# Patient Record
Sex: Female | Born: 1985 | Race: Black or African American | Hispanic: No | Marital: Single | Smoking: Current every day smoker
Health system: Southern US, Community
[De-identification: ages and names within clinical notes are randomized; demographics above are authoritative.]

## PROBLEM LIST (undated history)

## (undated) HISTORY — PX: CHOLECYSTECTOMY: SHX55

## (undated) HISTORY — PX: WISDOM TOOTH EXTRACTION: SHX21

---
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
## 2007-08-26 ENCOUNTER — Emergency Department (HOSPITAL_COMMUNITY): Admission: EM | Admit: 2007-08-26 | Discharge: 2007-08-26 | Payer: Self-pay | Admitting: Emergency Medicine

## 2009-04-23 ENCOUNTER — Emergency Department (HOSPITAL_COMMUNITY): Admission: EM | Admit: 2009-04-23 | Discharge: 2009-04-23 | Payer: Self-pay | Admitting: Family Medicine

## 2009-04-23 ENCOUNTER — Inpatient Hospital Stay (HOSPITAL_COMMUNITY): Admission: EM | Admit: 2009-04-23 | Discharge: 2009-04-25 | Payer: Self-pay | Admitting: Emergency Medicine

## 2009-04-24 ENCOUNTER — Encounter (INDEPENDENT_AMBULATORY_CARE_PROVIDER_SITE_OTHER): Payer: Self-pay | Admitting: General Surgery

## 2009-11-12 ENCOUNTER — Emergency Department (HOSPITAL_COMMUNITY): Admission: EM | Admit: 2009-11-12 | Discharge: 2009-11-12 | Payer: Self-pay | Admitting: Emergency Medicine

## 2010-02-05 ENCOUNTER — Emergency Department (HOSPITAL_COMMUNITY): Admission: EM | Admit: 2010-02-05 | Discharge: 2010-02-05 | Payer: Self-pay | Admitting: Emergency Medicine

## 2010-08-30 LAB — DIFFERENTIAL
Eosinophils Absolute: 0 10*3/uL (ref 0.0–0.7)
Lymphocytes Relative: 7 % — ABNORMAL LOW (ref 12–46)
Lymphs Abs: 1.1 10*3/uL (ref 0.7–4.0)
Monocytes Relative: 8 % (ref 3–12)
Neutrophils Relative %: 85 % — ABNORMAL HIGH (ref 43–77)

## 2010-08-30 LAB — COMPREHENSIVE METABOLIC PANEL
ALT: 23 U/L (ref 0–35)
AST: 24 U/L (ref 0–37)
AST: 36 U/L (ref 0–37)
Albumin: 3 g/dL — ABNORMAL LOW (ref 3.5–5.2)
Alkaline Phosphatase: 59 U/L (ref 39–117)
BUN: 3 mg/dL — ABNORMAL LOW (ref 6–23)
CO2: 25 mEq/L (ref 19–32)
CO2: 27 mEq/L (ref 19–32)
CO2: 30 mEq/L (ref 19–32)
Calcium: 9.1 mg/dL (ref 8.4–10.5)
Calcium: 9.2 mg/dL (ref 8.4–10.5)
Chloride: 103 mEq/L (ref 96–112)
Creatinine, Ser: 0.57 mg/dL (ref 0.4–1.2)
Creatinine, Ser: 0.59 mg/dL (ref 0.4–1.2)
Creatinine, Ser: 0.6 mg/dL (ref 0.4–1.2)
GFR calc Af Amer: >60 mL/min (ref >60)
GFR calc Af Amer: >60 mL/min (ref >60)
GFR calc non Af Amer: >60 mL/min (ref >60)
GFR calc non Af Amer: >60 mL/min (ref >60)
GFR calc non Af Amer: >60 mL/min (ref >60)
Glucose, Bld: 101 mg/dL — ABNORMAL HIGH (ref 70–99)
Glucose, Bld: 83 mg/dL (ref 70–99)
Potassium: 3.3 mEq/L — ABNORMAL LOW (ref 3.5–5.1)
Sodium: 134 mEq/L — ABNORMAL LOW (ref 135–145)
Sodium: 140 mEq/L (ref 135–145)
Total Bilirubin: 1.4 mg/dL — ABNORMAL HIGH (ref 0.3–1.2)
Total Protein: 6.7 g/dL (ref 6.0–8.3)
Total Protein: 7.8 g/dL (ref 6.0–8.3)

## 2010-08-30 LAB — CBC
HCT: 33.4 % — ABNORMAL LOW (ref 36.0–46.0)
Hemoglobin: 11.2 g/dL — ABNORMAL LOW (ref 12.0–15.0)
MCHC: 33.5 g/dL (ref 30.0–36.0)
MCHC: 33.7 g/dL (ref 30.0–36.0)
MCV: 78.9 fL (ref 78.0–100.0)
MCV: 79.1 fL (ref 78.0–100.0)
MCV: 79.5 fL (ref 78.0–100.0)
RBC: 4.07 MIL/uL (ref 3.87–5.11)
RBC: 4.23 MIL/uL (ref 3.87–5.11)
RDW: 13.5 % (ref 11.5–15.5)
RDW: 13.5 % (ref 11.5–15.5)
WBC: 15.9 10*3/uL — ABNORMAL HIGH (ref 4.0–10.5)

## 2010-08-30 LAB — POCT URINALYSIS DIP (DEVICE)
Nitrite: NEGATIVE
Protein, ur: 30 mg/dL — AB
pH: 6.5 (ref 5.0–8.0)

## 2010-08-30 LAB — PROTIME-INR
INR: 1.27 (ref 0.00–1.49)
Prothrombin Time: 15.8 seconds — ABNORMAL HIGH (ref 11.6–15.2)

## 2010-08-30 LAB — APTT: aPTT: 40 seconds — ABNORMAL HIGH (ref 24–37)

## 2010-08-30 LAB — LIPASE, BLOOD: Lipase: 13 U/L (ref 11–59)

## 2011-06-24 ENCOUNTER — Emergency Department (HOSPITAL_COMMUNITY)
Admission: EM | Admit: 2011-06-24 | Discharge: 2011-06-25 | Disposition: A | Discharge status: Home / Self Care | Payer: Self-pay | Attending: Emergency Medicine | Admitting: Emergency Medicine

## 2011-06-24 ENCOUNTER — Encounter (HOSPITAL_COMMUNITY): Payer: Self-pay | Admitting: Emergency Medicine

## 2011-06-24 DIAGNOSIS — M79609 Pain in unspecified limb: Secondary | ICD-10-CM | POA: Insufficient documentation

## 2011-06-24 DIAGNOSIS — M7989 Other specified soft tissue disorders: Secondary | ICD-10-CM | POA: Insufficient documentation

## 2011-06-24 DIAGNOSIS — L0291 Cutaneous abscess, unspecified: Secondary | ICD-10-CM

## 2011-06-24 DIAGNOSIS — IMO0002 Reserved for concepts with insufficient information to code with codable children: Secondary | ICD-10-CM | POA: Insufficient documentation

## 2011-06-24 DIAGNOSIS — L0390 Cellulitis, unspecified: Secondary | ICD-10-CM

## 2011-06-24 DIAGNOSIS — L02411 Cutaneous abscess of right axilla: Secondary | ICD-10-CM

## 2011-06-24 MED ORDER — HYDROMORPHONE HCL PF 1 MG/ML IJ SOLN
1.0000 mg | Freq: Once | INTRAMUSCULAR | Status: AC
  Administered 2011-06-24: 1 mg via INTRAVENOUS
  Filled 2011-06-24: qty 1

## 2011-06-24 MED ORDER — CLINDAMYCIN PHOSPHATE 900 MG/50ML IV SOLN
900.0000 mg | INTRAVENOUS | Status: AC
  Administered 2011-06-25: 900 mg via INTRAVENOUS
  Filled 2011-06-24: qty 50

## 2011-06-24 MED ORDER — SODIUM CHLORIDE 0.9 % IV BOLUS (SEPSIS)
1000.0000 mL | Freq: Once | INTRAVENOUS | Status: AC
  Administered 2011-06-24: 1000 mL via INTRAVENOUS

## 2011-06-24 NOTE — ED Notes (Signed)
Pt with abscess to R axilla x 1 week.  Pt states she nicked her R underarm while shaving and now has pain, redness and frank drainage of puss to R axilla. Pt states that her OBGYN stated she may have had a MRSA blister once, but that it was never confirmed.    Pt appears in great pain.

## 2011-06-24 NOTE — ED Provider Notes (Signed)
History     CSN: [id number]  Arrival date & time 06/24/11  2130   First MD Initiated Contact with Patient 06/24/11 2305      Chief Complaint  Patient presents with  . Abscess    (Consider location/radiation/quality/duration/timing/severity/associated sxs/prior treatment)  Patient is a 26 y.o. female presenting with abscess. The history is provided by the patient. No language interpreter was used.  Abscess   Patient presents with R axilla abscess that began over 1 week ago. The onset was gradual. She states that it has increased in size, pain, and swelling since onset. Patient states that she has had an abscess before but never this big or painful. She repots that she has had MRSA before. She denies fever, chills, and sweats. She reports that it "popped" after she arrived and has been draining since.   History reviewed. No pertinent past medical history.  Past Surgical History  Procedure Date  . Cholecystectomy     No family history on file.  History  Substance Use Topics  . Smoking status: Current Everyday Smoker  . Smokeless tobacco: Not on file  . Alcohol Use: Yes    OB History    Grav Para Term Preterm Abortions TAB SAB Ect Mult Living                  Review of Systems All pertinent positives and negatives reviewed in the history of present illness  Allergies  Review of patient's allergies indicates no known allergies.  Home Medications   Current Outpatient Rx  Name Route Sig Dispense Refill  . IBUPROFEN 200 MG PO TABS Oral Take 600 mg by mouth once.    Gaylord Shih TRI-CYCLEN (28) PO Oral Take 1 tablet by mouth daily.      BP 140/73  Pulse 81  Temp(Src) 98.2 F (36.8 C) (Oral)  Resp 16  SpO2 99%  LMP 06/09/2011  Physical Exam  Constitutional: She is oriented to person, place, and time. She appears well-developed and well-nourished. No distress.  HENT:  Head: Normocephalic and atraumatic.  Cardiovascular: Normal rate, regular rhythm and normal heart  sounds.  Exam reveals no gallop and no friction rub.   No murmur heard. Pulmonary/Chest: Effort normal and breath sounds normal. She has no wheezes. She has no rales.  Neurological: She is alert and oriented to person, place, and time.  Skin: Skin is warm and dry. She is not diaphoretic.          Abscess in R axilla with purulent drainage. Surrounding erythema and swelling down R anterior arm. Very tender.  Psychiatric: She has a normal mood and affect. Her behavior is normal.    ED Course  Procedures (including critical care time)  Labs Reviewed - No data to display   INCISION AND DRAINAGE Performed by: Carlyle Dolly Consent: Verbal consent obtained. Risks and benefits: risks, benefits and alternatives were discussed Type: abscess  Body area: R axilla  Anesthesia: local infiltration  Local anesthetic: lidocaine 2% with epinephrine  Anesthetic total: 7 ml  Complexity: complex Blunt dissection to break up loculations  Drainage: purulent  Drainage amount: moderate  Packing material: 1/4 in iodoform gauze  Patient tolerance: Patient tolerated the procedure well with no immediate complications.    MDM  Patient with R axilla abscess. Patient has history of MRSA. Treated with IV Clindamycin.  Patient had an area of cellulitis extending from the area of abscess to her mid biceps area.  She is advised to return here in 2  days for recheck of the area and packing removal.  She will return here sooner for any worsening in her condition        Carlyle Dolly, PA-C 06/25/11 [phone]

## 2011-06-24 NOTE — ED Notes (Signed)
PT. REPORTS ABSCESS AT RIGHT AXILLA WITH DRAINAGE ONSET LAST WEEK .

## 2011-06-25 MED ORDER — HYDROCODONE-ACETAMINOPHEN 5-325 MG PO TABS: 1.0000 | ORAL_TABLET | ORAL | Status: AC | PRN

## 2011-06-25 MED ORDER — CLINDAMYCIN HCL 150 MG PO CAPS: ORAL_CAPSULE | ORAL | Status: DC

## 2011-06-25 NOTE — Discharge Instructions (Signed)
Return here in 2 days for recheck.  Keep the area covered.  You can use warm compresses around the area.

## 2011-06-25 NOTE — ED Provider Notes (Signed)
Medical screening examination/treatment/procedure(s) were performed by non-physician practitioner and as supervising physician I was immediately available for consultation/collaboration.  Loren Racer, MD 06/25/11 [phone]

## 2011-06-27 ENCOUNTER — Encounter (HOSPITAL_COMMUNITY): Payer: Self-pay | Admitting: *Deleted

## 2011-06-27 ENCOUNTER — Emergency Department (HOSPITAL_COMMUNITY)
Admission: EM | Admit: 2011-06-27 | Discharge: 2011-06-27 | Disposition: A | Discharge status: Home / Self Care | Payer: Self-pay | Attending: Emergency Medicine | Admitting: Emergency Medicine

## 2011-06-27 DIAGNOSIS — L02411 Cutaneous abscess of right axilla: Secondary | ICD-10-CM

## 2011-06-27 DIAGNOSIS — Z09 Encounter for follow-up examination after completed treatment for conditions other than malignant neoplasm: Secondary | ICD-10-CM | POA: Insufficient documentation

## 2011-06-27 DIAGNOSIS — IMO0002 Reserved for concepts with insufficient information to code with codable children: Secondary | ICD-10-CM | POA: Insufficient documentation

## 2011-06-27 NOTE — ED Notes (Signed)
Pt reports being here for wound recheck of abscess under right arm, had it drained on Monday? Pt has no other complaints.

## 2011-06-27 NOTE — ED Provider Notes (Signed)
Medical screening examination/treatment/procedure(s) were performed by non-physician practitioner and as supervising physician I was immediately available for consultation/collaboration.  Flint Melter, MD 06/27/11 2031

## 2011-06-27 NOTE — ED Provider Notes (Signed)
History     CSN: [id number]  Arrival date & time 06/27/11  1478   First MD Initiated Contact with Patient 06/27/11 0940      Chief Complaint  Patient presents with  . Wound Check    (Consider location/radiation/quality/duration/timing/severity/associated sxs/prior treatment) Patient is a 26 y.o. female presenting with wound check. The history is provided by the patient.  Wound Check  She was treated in the ED 2 to 3 days ago. Previous treatment in the ED includes I&D of abscess. Treatments since wound repair include oral antibiotics.  Pt with right axilla abscess I&D 3 days ago. Here for wound recheck. States feeling better. Denies worsening of the abscess, denies fever,chills, malaise. Still tender to palpation. On clindamycin.   History reviewed. No pertinent past medical history.  Past Surgical History  Procedure Date  . Cholecystectomy     History reviewed. No pertinent family history.  History  Substance Use Topics  . Smoking status: Current Everyday Smoker  . Smokeless tobacco: Not on file  . Alcohol Use: Yes    OB History    Grav Para Term Preterm Abortions TAB SAB Ect Mult Living                  Review of Systems  Constitutional: Negative for fever and chills.  HENT: Negative.   Eyes: Negative.   Respiratory: Negative.   Cardiovascular: Negative.   Gastrointestinal: Negative.   Musculoskeletal: Negative.   Skin: Positive for wound.  Neurological: Negative.   Psychiatric/Behavioral: Negative.     Allergies  Review of patient's allergies indicates no known allergies.  Home Medications   Current Outpatient Rx  Name Route Sig Dispense Refill  . CLINDAMYCIN HCL 150 MG PO CAPS Oral Take 150 mg by mouth 3 (three) times daily. 3 PO TID for 10 days    . HYDROCODONE-ACETAMINOPHEN 5-325 MG PO TABS Oral Take 1 tablet by mouth every 4 (four) hours as needed for pain. 15 tablet 0  . IBUPROFEN 200 MG PO TABS Oral Take 600 mg by mouth once.    Gaylord Shih  TRI-CYCLEN (28) PO Oral Take 1 tablet by mouth daily.      BP 128/70  Pulse 77  Temp(Src) 98.2 F (36.8 C) (Oral)  Resp 16  SpO2 99%  LMP 06/09/2011  Physical Exam  Nursing note and vitals reviewed. Constitutional: She is oriented to person, place, and time. She appears well-developed and well-nourished. No distress.  HENT:  Head: Normocephalic and atraumatic.  Eyes: Conjunctivae are normal.  Neck: Neck supple.  Cardiovascular: Normal rate, regular rhythm and normal heart sounds.   Pulmonary/Chest: Effort normal and breath sounds normal. No respiratory distress.  Musculoskeletal: Normal range of motion.  Neurological: She is alert and oriented to person, place, and time.  Skin: Skin is warm and dry. No rash noted. No erythema.       3cm abscess to the right axilla, packed. No surrounding cellulitis, no drainage, tender  Psychiatric: She has a normal mood and affect.    ED Course  Procedures (including critical care time)  Abscess healing well. No surrounding cellulitis. Packing removed. No drainage. WIll d/c home with follow up as needed. Continue antibiotics.  No diagnosis found.    MDM         Lottie Mussel, PA 06/27/11 0959  Lottie Mussel, PA 06/27/11 1000

## 2012-02-05 IMAGING — CR DG FOOT COMPLETE 3+V*R*
3 series · 3 of 3 positions shown · non-contrast
Comparison: None.

CLINICAL DATA: Foot injury and pain.  Stepped on glass.

RIGHT FOOT COMPLETE - 3+ VIEW

[t foot ap right]
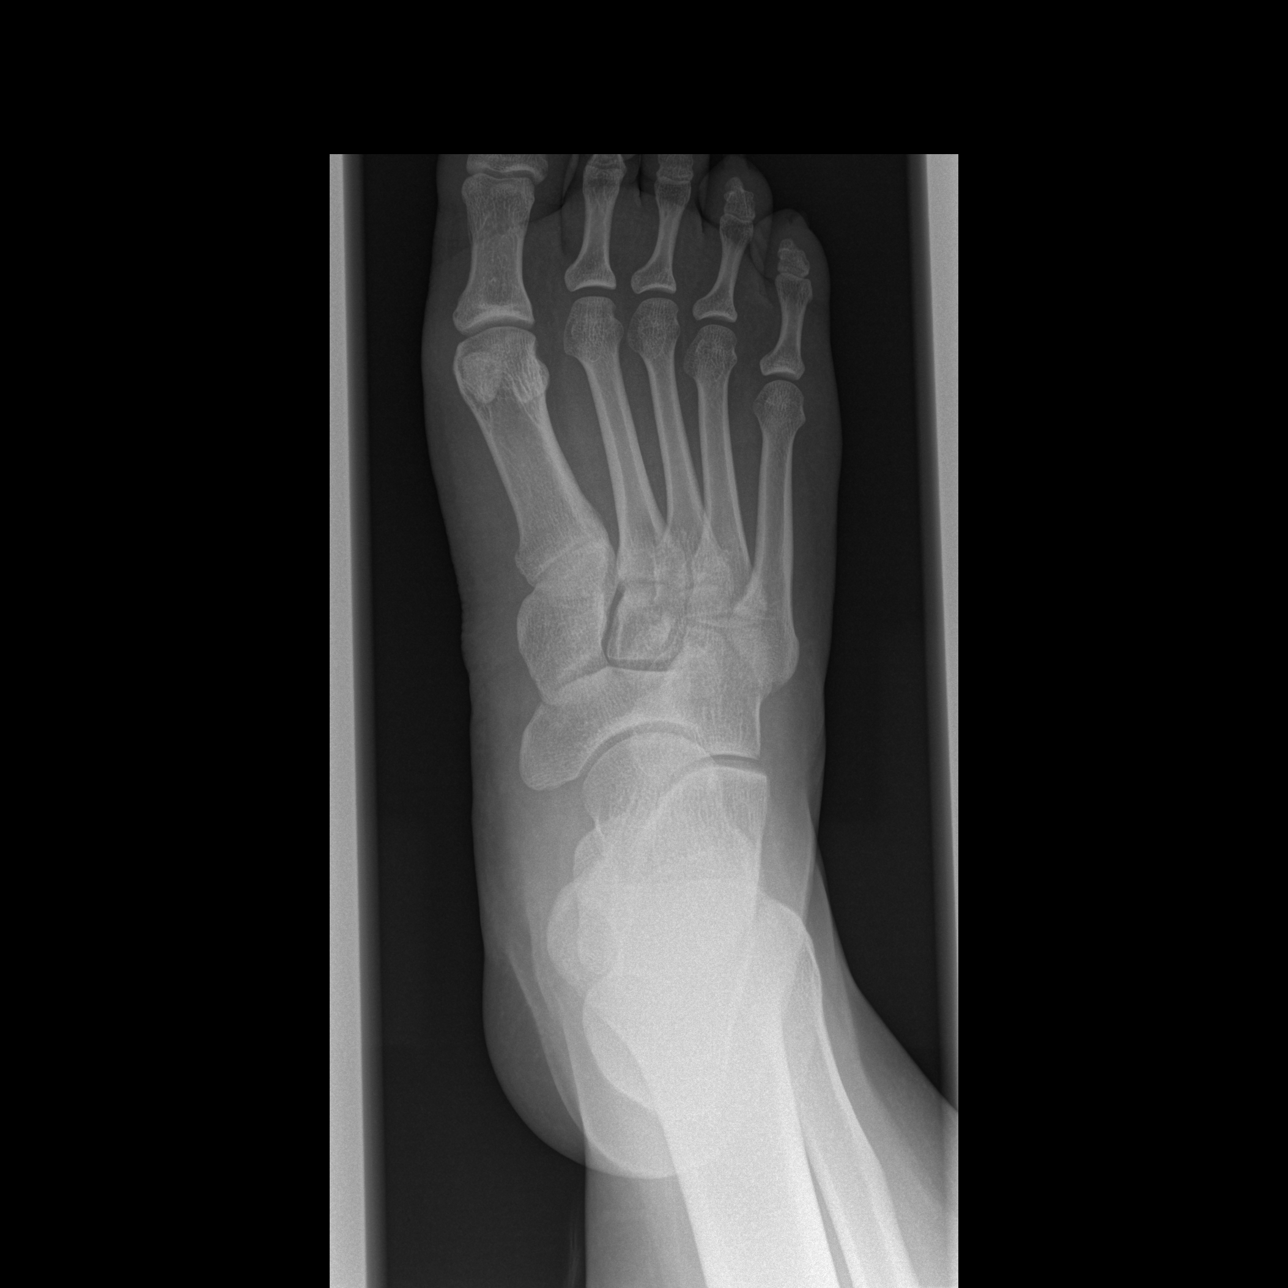

[t foot oblique right]
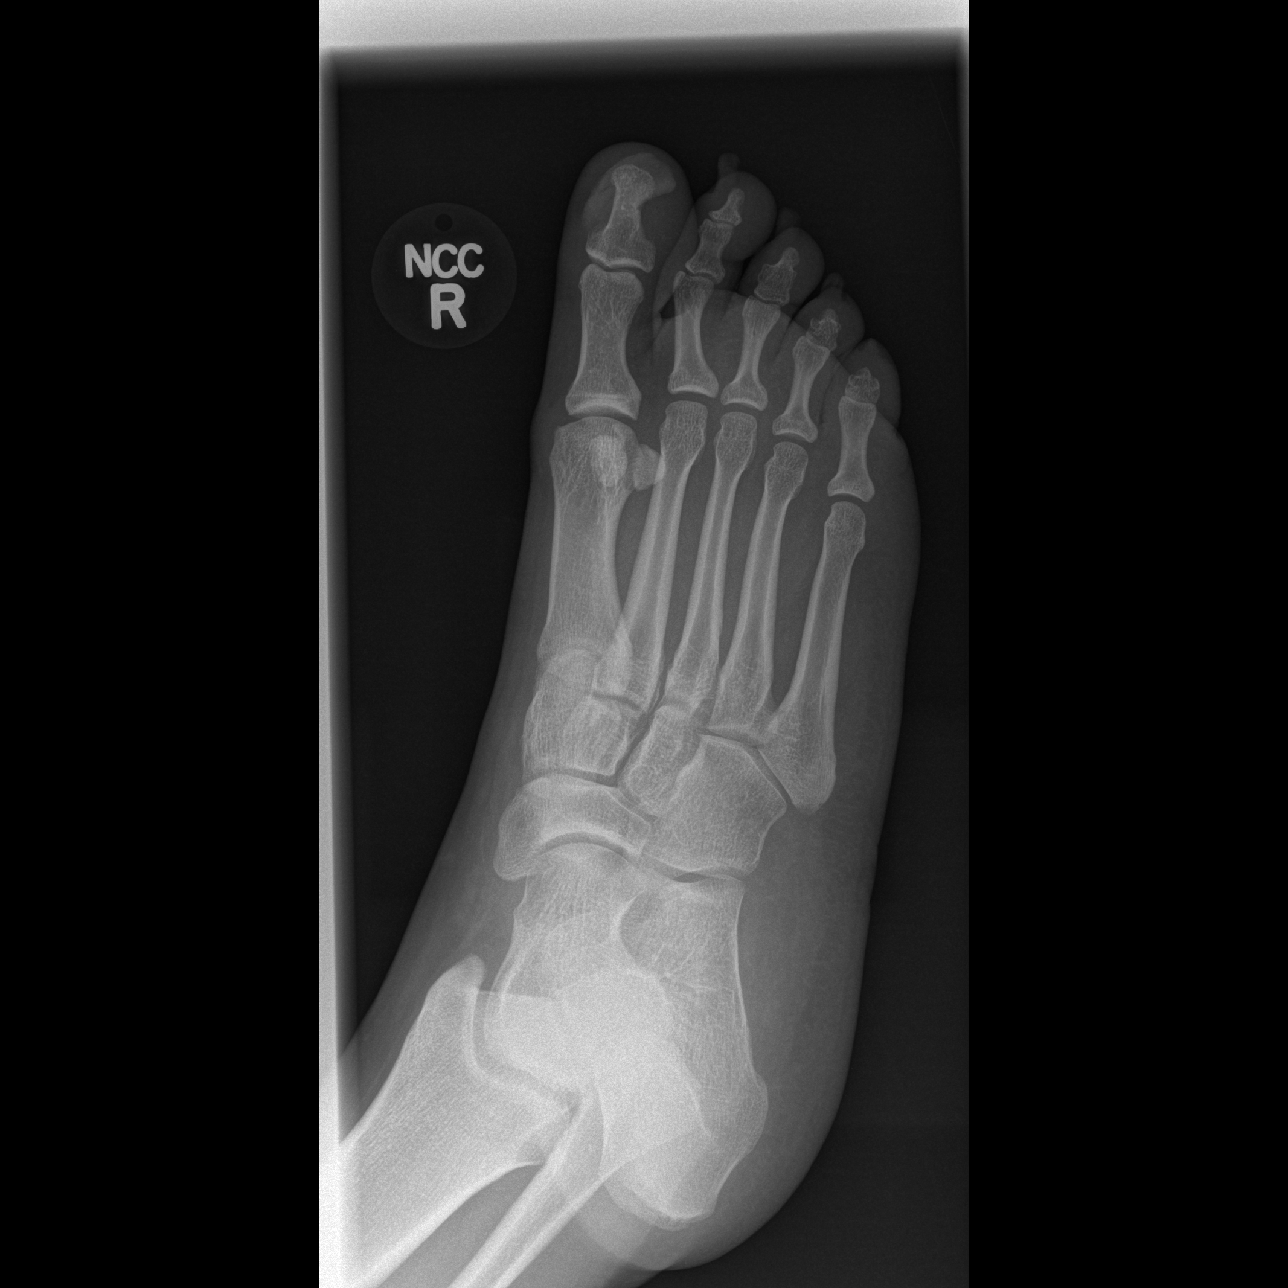

[t foot lat right]
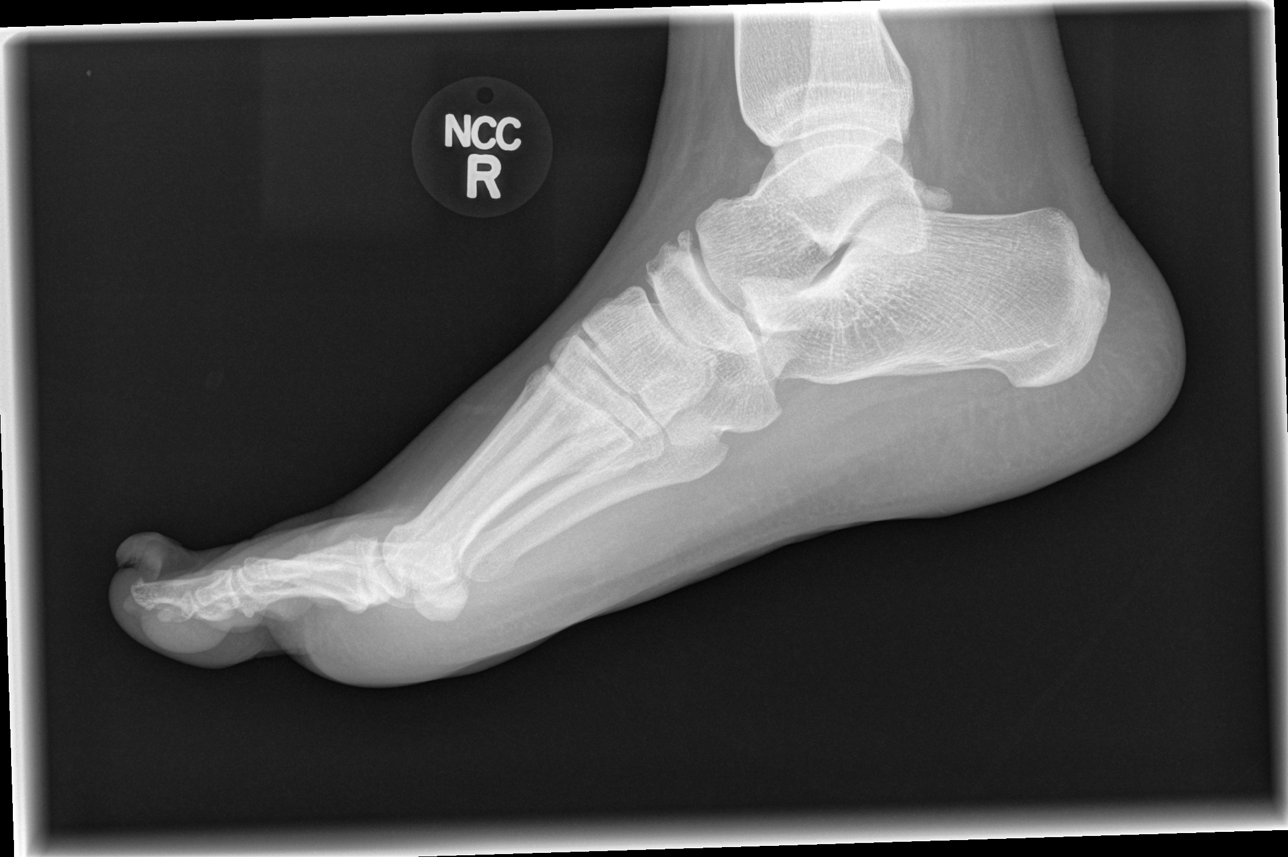

[3 of 3 positions shown; findings below may reference images not displayed]

FINDINGS: There is no evidence of fracture or dislocation.  There
is no evidence of arthropathy or other focal bone abnormality.
Soft tissues are unremarkable. No evidence of radiopaque foreign
body.
IMPRESSION: Negative.

## 2019-10-27 LAB — OB RESULTS CONSOLE RUBELLA ANTIBODY, IGM: Rubella: IMMUNE

## 2019-10-27 LAB — OB RESULTS CONSOLE HIV ANTIBODY (ROUTINE TESTING): HIV: NONREACTIVE

## 2019-10-27 LAB — OB RESULTS CONSOLE HEPATITIS B SURFACE ANTIGEN: Hepatitis B Surface Ag: NEGATIVE

## 2019-11-03 LAB — OB RESULTS CONSOLE GC/CHLAMYDIA
Chlamydia: NEGATIVE
Gonorrhea: NEGATIVE

## 2020-03-07 LAB — OB RESULTS CONSOLE RPR: RPR: NONREACTIVE

## 2020-05-02 LAB — OB RESULTS CONSOLE GBS: GBS: NEGATIVE

## 2020-05-12 ENCOUNTER — Other Ambulatory Visit: Payer: Self-pay | Admitting: Obstetrics & Gynecology

## 2020-05-13 ENCOUNTER — Telehealth (HOSPITAL_COMMUNITY): Payer: Self-pay | Admitting: *Deleted

## 2020-05-13 ENCOUNTER — Encounter (HOSPITAL_COMMUNITY): Payer: Self-pay | Admitting: *Deleted

## 2020-05-13 NOTE — Telephone Encounter (Signed)
Preadmission screen  

## 2020-05-23 ENCOUNTER — Other Ambulatory Visit (HOSPITAL_COMMUNITY): Payer: Self-pay

## 2020-05-24 ENCOUNTER — Inpatient Hospital Stay (HOSPITAL_COMMUNITY)
Admission: AD | Admit: 2020-05-24 | Payer: BC Managed Care – PPO | Source: Home / Self Care | Admitting: Obstetrics & Gynecology

## 2020-05-24 ENCOUNTER — Inpatient Hospital Stay (HOSPITAL_COMMUNITY): Payer: BC Managed Care – PPO

## 2020-05-27 ENCOUNTER — Other Ambulatory Visit (HOSPITAL_COMMUNITY): Payer: Self-pay

## 2020-05-28 NOTE — L&D Delivery Note (Signed)
Delivery Note At 5:54 AM a viable and healthy female was delivered via Vaginal, Spontaneous (Presentation: OA   ).  APGAR: 9, 9; weight pending .   Placenta status: Spontaneous, Intact.  Cord: 3 vessels with the following complications: None.  Cord pH: NA  Anesthesia:  Epidural Episiotomy: None Lacerations:  Small 1st degree vaginal  Suture Repair: 3.0 vicryl rapide Est. Blood Loss (mL):  650 cc-- > 1000 mcg rectal Cytotec placed for lower segment atony  Mom to postpartum.  Baby to Couplet care / Skin to Skin.  Robley Fries 05/30/2020, 6:17 AM

## 2020-05-29 ENCOUNTER — Inpatient Hospital Stay (HOSPITAL_COMMUNITY): Payer: BC Managed Care – PPO

## 2020-05-29 ENCOUNTER — Other Ambulatory Visit: Payer: Self-pay

## 2020-05-29 ENCOUNTER — Inpatient Hospital Stay (HOSPITAL_COMMUNITY)
Admission: AD | Admit: 2020-05-29 | Discharge: 2020-06-01 | DRG: 807 | Disposition: A | Discharge status: Home / Self Care | Payer: BC Managed Care – PPO | Attending: Obstetrics & Gynecology | Admitting: Obstetrics & Gynecology

## 2020-05-29 ENCOUNTER — Encounter (HOSPITAL_COMMUNITY): Payer: Self-pay | Admitting: Obstetrics & Gynecology

## 2020-05-29 DIAGNOSIS — O99334 Smoking (tobacco) complicating childbirth: Secondary | ICD-10-CM | POA: Diagnosis present

## 2020-05-29 DIAGNOSIS — O99344 Other mental disorders complicating childbirth: Secondary | ICD-10-CM | POA: Diagnosis present

## 2020-05-29 DIAGNOSIS — O99214 Obesity complicating childbirth: Principal | ICD-10-CM | POA: Diagnosis present

## 2020-05-29 DIAGNOSIS — O139 Gestational [pregnancy-induced] hypertension without significant proteinuria, unspecified trimester: Secondary | ICD-10-CM

## 2020-05-29 DIAGNOSIS — Z20822 Contact with and (suspected) exposure to covid-19: Secondary | ICD-10-CM | POA: Diagnosis present

## 2020-05-29 DIAGNOSIS — Z658 Other specified problems related to psychosocial circumstances: Secondary | ICD-10-CM

## 2020-05-29 DIAGNOSIS — F17200 Nicotine dependence, unspecified, uncomplicated: Secondary | ICD-10-CM | POA: Diagnosis present

## 2020-05-29 DIAGNOSIS — F32A Depression, unspecified: Secondary | ICD-10-CM | POA: Diagnosis present

## 2020-05-29 DIAGNOSIS — O134 Gestational [pregnancy-induced] hypertension without significant proteinuria, complicating childbirth: Secondary | ICD-10-CM | POA: Diagnosis present

## 2020-05-29 DIAGNOSIS — Z3A40 40 weeks gestation of pregnancy: Secondary | ICD-10-CM | POA: Diagnosis not present

## 2020-05-29 DIAGNOSIS — F419 Anxiety disorder, unspecified: Secondary | ICD-10-CM | POA: Diagnosis present

## 2020-05-29 DIAGNOSIS — Z6841 Body Mass Index (BMI) 40.0 and over, adult: Secondary | ICD-10-CM

## 2020-05-29 DIAGNOSIS — Z3490 Encounter for supervision of normal pregnancy, unspecified, unspecified trimester: Secondary | ICD-10-CM | POA: Diagnosis present

## 2020-05-29 LAB — CBC
HCT: 41.4 % (ref 36.0–46.0)
Hemoglobin: 13.4 g/dL (ref 12.0–15.0)
MCH: 25.7 pg — ABNORMAL LOW (ref 26.0–34.0)
MCHC: 32.4 g/dL (ref 30.0–36.0)
MCV: 79.3 fL — ABNORMAL LOW (ref 80.0–100.0)
Platelets: 258 10*3/uL (ref 150–400)
RBC: 5.22 MIL/uL — ABNORMAL HIGH (ref 3.87–5.11)
RDW: 14.8 % (ref 11.5–15.5)
WBC: 8.8 10*3/uL (ref 4.0–10.5)
nRBC: 0 % (ref 0.0–0.2)

## 2020-05-29 LAB — RESP PANEL BY RT-PCR (FLU A&B, COVID) ARPGX2
Influenza A by PCR: NEGATIVE
Influenza B by PCR: NEGATIVE
SARS Coronavirus 2 by RT PCR: NEGATIVE

## 2020-05-29 LAB — TYPE AND SCREEN
ABO/RH(D): AB POS
Antibody Screen: NEGATIVE

## 2020-05-29 MED ORDER — OXYTOCIN BOLUS FROM INFUSION
333.0000 mL | Freq: Once | INTRAVENOUS | Status: AC
Start: 2020-05-29 — End: 2020-05-30
  Administered 2020-05-30: 333 mL via INTRAVENOUS

## 2020-05-29 MED ORDER — OXYTOCIN-SODIUM CHLORIDE 30-0.9 UT/500ML-% IV SOLN
1.0000 m[IU]/min | INTRAVENOUS | Status: DC
Start: 2020-05-29 — End: 2020-05-30
  Administered 2020-05-29: 1 m[IU]/min via INTRAVENOUS

## 2020-05-29 MED ORDER — TERBUTALINE SULFATE 1 MG/ML IJ SOLN: 0.2500 mg | Freq: Once | INTRAMUSCULAR | Status: DC | PRN

## 2020-05-29 MED ORDER — SOD CITRATE-CITRIC ACID 500-334 MG/5ML PO SOLN: 30.0000 mL | ORAL | Status: DC | PRN

## 2020-05-29 MED ORDER — BUTORPHANOL TARTRATE 1 MG/ML IJ SOLN: 1.0000 mg | INTRAMUSCULAR | Status: DC | PRN

## 2020-05-29 MED ORDER — LACTATED RINGERS IV SOLN: INTRAVENOUS | Status: DC

## 2020-05-29 MED ORDER — OXYTOCIN-SODIUM CHLORIDE 30-0.9 UT/500ML-% IV SOLN
2.5000 [IU]/h | INTRAVENOUS | Status: DC
  Administered 2020-05-30: 2.5 [IU]/h via INTRAVENOUS
  Filled 2020-05-29: qty 500

## 2020-05-29 MED ORDER — LIDOCAINE HCL (PF) 1 % IJ SOLN: 30.0000 mL | INTRAMUSCULAR | Status: DC | PRN

## 2020-05-29 MED ORDER — MISOPROSTOL 25 MCG QUARTER TABLET
25.0000 ug | ORAL_TABLET | ORAL | Status: AC | PRN
  Administered 2020-05-29 (×2): 25 ug via VAGINAL
  Filled 2020-05-29 (×2): qty 1

## 2020-05-29 MED ORDER — ONDANSETRON HCL 4 MG/2ML IJ SOLN
4.0000 mg | Freq: Four times a day (QID) | INTRAMUSCULAR | Status: DC | PRN
  Administered 2020-05-30: 4 mg via INTRAVENOUS
  Filled 2020-05-29: qty 2

## 2020-05-29 MED ORDER — ACETAMINOPHEN 325 MG PO TABS: 650.0000 mg | ORAL_TABLET | ORAL | Status: DC | PRN

## 2020-05-29 MED ORDER — LACTATED RINGERS IV SOLN: 500.0000 mL | INTRAVENOUS | Status: DC | PRN

## 2020-05-29 NOTE — Progress Notes (Signed)
Spoke with patient, she picked up her phone time time. Reports she is waiting on her Mom or FOB to bring her in. Mother is on vacation in [place] and [place] is in [place] and now in [place], only 45 min away, was supposed to get here by 10, so she told hospital RN she can come in by 10.30 am and is 12 noon now.  She didn't ask a friend or decide to take Benedetto Goad to hospital for IOL. Poor support structure, will not improve now or postpartum, advised her to come in regardless of driver, hospital/ me or RN doesn't need support person if they are not caring enough to show up, she needs to drive to hospital herself and FOB or her mother can show up when/ if they want.  She agreed. Reviewed post-term with BMI 53 as her risk factor, needing IOL --V.Ree Alcalde MD

## 2020-05-29 NOTE — Progress Notes (Signed)
IOL 40.1 wks, for dates and BMI 53. S/p Cytotec  2 doses. Patient arrived for IOL late due to not having a ride.   Subjective: No complaints   Objective: BP (!) 148/75   Pulse 81   Temp 98 F (36.7 C) (Oral)   Resp 17   Ht 5\' 3"  (1.6 m)   Wt (!) 136.4 kg   BMI 53.28 kg/m   FHT:  FHR: 150 bpm, variability: moderate,  accelerations:  Present,  decelerations:  Absent UC:   regular, every 2 minutes SVE:   Dilation: 2 Effacement (%): 60 Station: -2 Exam by:: [email or web address] Chipps RN  Assessment / Plan: IOL, S/p cytotec x 2 doses. Favorable cervix now, UCs noted. Okay to start pitocin to maintain UCs patter, d/w RN, avoid hyperstimulation and hold back pitocin if UCs q 1-2 min.   Fetal Wellbeing:  Category I Pain Control:  Options d/w pt  Anticipated MOD:  NSVD planned, EFW 7.1/2 - 8 lbs  Irving Burton 05/29/2020, 10:47 PM

## 2020-05-29 NOTE — Progress Notes (Signed)
IOL 40.1 wks, for dates and BMI 53. S/p Cytotec  2 doses. Patient arrived for IOL late due to not having a ride.   Subjective: No complaints   Objective: BP (!) 148/75   Pulse 81   Temp 98 F (36.7 C) (Oral)   Resp 17   Ht 5\' 3"  (1.6 m)   Wt (!) 136.4 kg   BMI 53.28 kg/m   FHT:  FHR: 150 bpm, variability: moderate,  accelerations:  Present,  decelerations:  Absent UC:   regular, every 2 minutes SVE:   Dilation: 2 Effacement (%): 60 Station: -2 Exam by:: [email or web address] Chipps RN  Assessment / Plan: IOL, S/p cytotec x 2 doses. Favorable cervix now, UCs noted. Okay to start pitocin to maintain UCs patter, d/w RN, avoid hyperstimulation and hold back pitocin if UCs q 1-2 min.   Fetal Wellbeing:  Category I Pain Control:  Options d/w pt  Anticipated MOD:  NSVD planned, EFW 7.1/2  lbs  Irving Burton 05/29/2020, 10:50 PM

## 2020-05-29 NOTE — Progress Notes (Signed)
Patient has been called by L&D to come in ASAP for AM IOL but is awaiting a ride by family member

## 2020-05-29 NOTE — H&P (Addendum)
[place] Sonya Brooks is a 35 y.o. female G3P0020, is supposed to for IOL, [facility] 05/28/20. Marland Kitchen Pt presented around 1.30 pm.  She was supposed to be here for IOL b/w 8-9  am, L&D RN called, she said she is waiting on support person to bring her, they are not in town. I called her 4 times- not picking up my calls. Maternal obesity, depression/ anxiety, poor family support, FOB in picture but not available most days since truck driver Growth for S>D, obesity  - 28 wks 2'12" 48% and at 34 wks- 4'15" with AC 17%. Weekly NST from 36 wks for obesity- reactive, missed 38 wk visit due to insurance issues  TAB and SAB Hx. Unplanned pregnancy, FOB has no interest after being together 4 yrs.   OB History    Gravida  3   Para      Term      Preterm      AB  2   Living        SAB  2   IAB      Ectopic      Multiple      Live Births             No past medical history on file. Past Surgical History:  Procedure Laterality Date  . CHOLECYSTECTOMY    . WISDOM TOOTH EXTRACTION     Family History: family history includes Diabetes in her maternal grandfather, maternal grandmother, paternal grandfather, and paternal grandmother; Heart disease in her father and paternal grandfather; Hypertension in her maternal grandfather, maternal grandmother, paternal grandfather, and paternal grandmother; Stroke in her maternal grandfather. Social History:  reports that she has been smoking. She does not have any smokeless tobacco history on file. She reports current alcohol use. She reports that she does not use drugs.     Maternal Diabetes: No Genetic Screening: Normal NIPS Maternal Ultrasounds/Referrals: Normal Fetal Ultrasounds or other Referrals:  None Maternal Substance Abuse:  No Significant Maternal Medications:  Meds include: Other:  Pepcid prn Significant Maternal Lab Results:  Group B Strep negative Other Comments:  poor family support   Review of Systems History   There were no vitals taken  for this visit. Exam Physical Exam  Patient Vitals for the past 24 hrs:  BP Temp Temp src Pulse Resp Height Weight  05/29/20 1435 120/60 -- -- (!) 105 16 -- --  05/29/20 1335 138/74 98 F (36.7 C) Oral (!) 114 18 5\' 3"  (1.6 Sonya) (!) 136.4 kg  Physical exam:  A&O x 3, no acute distress. Pleasant HEENT neg, no thyromegaly Lungs CTA bilat CV RRR, S1S2 normal Abdo soft, non tender, non acute Extr no edema/ tenderness Pelvic Cx closed, thick, Vx, high- per RN FHT  150s mod variability, + accels, no decels cat I Toco none   Prenatal labs: ABO, Rh:  AB+ Antibody:  Neg Rubella:  Imm RPR:   NR HBsAg:   Neg HIV:   Neg GBS:   Neg Glucola nl AFP1 nl NIPS nl    Assessment/Plan: 35 yo, G3P0, here for IOL for dates and BMI. She moved her 12/28 IOL to today due to insurance and support issues and is still not here since 4 hrs of call from L&D  Cytotec, foley and then pitocin as needed  Epidural advised due to BMI should there be a need for C/section, EFW 7.1/2- 8 lbs. Pelvic borderline   PP- watch for depression, recc Zoloft since has Hx of anxiety/depression and  poor social structure, also Social work Restaurant manager, fast food Sonya Brooks 05/29/2020, 3.3 pm

## 2020-05-30 ENCOUNTER — Encounter (HOSPITAL_COMMUNITY): Payer: Self-pay | Admitting: Obstetrics & Gynecology

## 2020-05-30 ENCOUNTER — Inpatient Hospital Stay (HOSPITAL_COMMUNITY): Payer: BC Managed Care – PPO | Admitting: Anesthesiology

## 2020-05-30 DIAGNOSIS — Z658 Other specified problems related to psychosocial circumstances: Secondary | ICD-10-CM

## 2020-05-30 DIAGNOSIS — F419 Anxiety disorder, unspecified: Secondary | ICD-10-CM | POA: Diagnosis present

## 2020-05-30 DIAGNOSIS — Z6841 Body Mass Index (BMI) 40.0 and over, adult: Secondary | ICD-10-CM

## 2020-05-30 LAB — COMPREHENSIVE METABOLIC PANEL
ALT: 10 U/L (ref 0–44)
AST: 23 U/L (ref 15–41)
Albumin: 2.5 g/dL — ABNORMAL LOW (ref 3.5–5.0)
Alkaline Phosphatase: 104 U/L (ref 38–126)
Anion gap: 13 (ref 5–15)
BUN: 5 mg/dL — ABNORMAL LOW (ref 6–20)
CO2: 19 mmol/L — ABNORMAL LOW (ref 22–32)
Calcium: 9.6 mg/dL (ref 8.9–10.3)
Chloride: 105 mmol/L (ref 98–111)
Creatinine, Ser: 0.76 mg/dL (ref 0.44–1.00)
GFR, Estimated: >60 mL/min (ref >60)
Glucose, Bld: 137 mg/dL — ABNORMAL HIGH (ref 70–99)
Potassium: 3.8 mmol/L (ref 3.5–5.1)
Sodium: 137 mmol/L (ref 135–145)
Total Bilirubin: 0.8 mg/dL (ref 0.3–1.2)
Total Protein: 5.6 g/dL — ABNORMAL LOW (ref 6.5–8.1)

## 2020-05-30 LAB — PROTEIN / CREATININE RATIO, URINE
Creatinine, Urine: 137.97 mg/dL
Protein Creatinine Ratio: 0.14 mg/mg{Cre} (ref 0.00–0.15)
Total Protein, Urine: 20 mg/dL

## 2020-05-30 LAB — URIC ACID: Uric Acid, Serum: 4.3 mg/dL (ref 2.5–7.1)

## 2020-05-30 LAB — RPR: RPR Ser Ql: NONREACTIVE

## 2020-05-30 MED ORDER — PHENYLEPHRINE 40 MCG/ML (10ML) SYRINGE FOR IV PUSH (FOR BLOOD PRESSURE SUPPORT): 80.0000 ug | PREFILLED_SYRINGE | INTRAVENOUS | Status: DC | PRN

## 2020-05-30 MED ORDER — LACTATED RINGERS IV SOLN: 500.0000 mL | Freq: Once | INTRAVENOUS | Status: DC

## 2020-05-30 MED ORDER — SODIUM CHLORIDE (PF) 0.9 % IJ SOLN
INTRAMUSCULAR | Status: DC | PRN
  Administered 2020-05-30: 12 mL/h via EPIDURAL

## 2020-05-30 MED ORDER — DIBUCAINE (PERIANAL) 1 % EX OINT: 1.0000 "application " | TOPICAL_OINTMENT | CUTANEOUS | Status: DC | PRN

## 2020-05-30 MED ORDER — ONDANSETRON HCL 4 MG/2ML IJ SOLN: 4.0000 mg | INTRAMUSCULAR | Status: DC | PRN

## 2020-05-30 MED ORDER — ACETAMINOPHEN 325 MG PO TABS: 650.0000 mg | ORAL_TABLET | ORAL | Status: DC | PRN

## 2020-05-30 MED ORDER — COCONUT OIL OIL
1.0000 "application " | TOPICAL_OIL | Status: DC | PRN
  Administered 2020-06-01: 1 via TOPICAL

## 2020-05-30 MED ORDER — IBUPROFEN 600 MG PO TABS
600.0000 mg | ORAL_TABLET | Freq: Four times a day (QID) | ORAL | Status: DC
  Administered 2020-05-30 – 2020-06-01 (×9): 600 mg via ORAL
  Filled 2020-05-30 (×9): qty 1

## 2020-05-30 MED ORDER — DIPHENHYDRAMINE HCL 25 MG PO CAPS
25.0000 mg | ORAL_CAPSULE | Freq: Four times a day (QID) | ORAL | Status: DC | PRN
Start: 2020-05-30 — End: 2020-06-01

## 2020-05-30 MED ORDER — PRENATAL MULTIVITAMIN CH
1.0000 | ORAL_TABLET | Freq: Every day | ORAL | Status: DC
  Filled 2020-05-30 (×3): qty 1

## 2020-05-30 MED ORDER — DIPHENHYDRAMINE HCL 50 MG/ML IJ SOLN: 12.5000 mg | INTRAMUSCULAR | Status: DC | PRN

## 2020-05-30 MED ORDER — ONDANSETRON HCL 4 MG PO TABS: 4.0000 mg | ORAL_TABLET | ORAL | Status: DC | PRN

## 2020-05-30 MED ORDER — WITCH HAZEL-GLYCERIN EX PADS: 1.0000 "application " | MEDICATED_PAD | CUTANEOUS | Status: DC | PRN

## 2020-05-30 MED ORDER — LIDOCAINE HCL (PF) 1 % IJ SOLN
INTRAMUSCULAR | Status: DC | PRN
  Administered 2020-05-30: 5 mL via EPIDURAL
  Administered 2020-05-30: 3 mL via EPIDURAL
  Administered 2020-05-30: 2 mL via EPIDURAL

## 2020-05-30 MED ORDER — BENZOCAINE-MENTHOL 20-0.5 % EX AERO: 1.0000 "application " | INHALATION_SPRAY | CUTANEOUS | Status: DC | PRN

## 2020-05-30 MED ORDER — EPHEDRINE 5 MG/ML INJ: 10.0000 mg | INTRAVENOUS | Status: DC | PRN

## 2020-05-30 MED ORDER — SENNOSIDES-DOCUSATE SODIUM 8.6-50 MG PO TABS
2.0000 | ORAL_TABLET | Freq: Every day | ORAL | Status: DC
  Administered 2020-05-31 – 2020-06-01 (×2): 2 via ORAL
  Filled 2020-05-30 (×2): qty 2

## 2020-05-30 MED ORDER — ZOLPIDEM TARTRATE 5 MG PO TABS: 5.0000 mg | ORAL_TABLET | Freq: Every evening | ORAL | Status: DC | PRN

## 2020-05-30 MED ORDER — MISOPROSTOL 200 MCG PO TABS
1000.0000 ug | ORAL_TABLET | Freq: Once | ORAL | Status: AC
  Administered 2020-05-30: 1000 ug via RECTAL

## 2020-05-30 MED ORDER — FENTANYL-BUPIVACAINE-NACL 0.5-0.125-0.9 MG/250ML-% EP SOLN
EPIDURAL | Status: AC
  Filled 2020-05-30: qty 250

## 2020-05-30 MED ORDER — SIMETHICONE 80 MG PO CHEW: 80.0000 mg | CHEWABLE_TABLET | ORAL | Status: DC | PRN

## 2020-05-30 MED ORDER — FENTANYL-BUPIVACAINE-NACL 0.5-0.125-0.9 MG/250ML-% EP SOLN
12.0000 mL/h | EPIDURAL | Status: DC | PRN
Start: 2020-05-30 — End: 2020-05-30

## 2020-05-30 MED ORDER — NIFEDIPINE ER OSMOTIC RELEASE 30 MG PO TB24
30.0000 mg | ORAL_TABLET | Freq: Every day | ORAL | Status: DC
  Administered 2020-05-30 – 2020-06-01 (×3): 30 mg via ORAL
  Filled 2020-05-30 (×3): qty 1

## 2020-05-30 MED ORDER — TETANUS-DIPHTH-ACELL PERTUSSIS 5-2.5-18.5 LF-MCG/0.5 IM SUSY: 0.5000 mL | PREFILLED_SYRINGE | Freq: Once | INTRAMUSCULAR | Status: DC

## 2020-05-30 NOTE — Progress Notes (Signed)
SVD day 0. Asymptomatic female. Elevated BPs since admission with one severe range PP but repeat improved without antiHTN med.  BP (!) 117/48   Pulse 92   Temp 98.4 F (36.9 C) (Oral)   Resp 16   Ht 5\' 3"  (1.6 m)   Wt (!) 136.4 kg   SpO2 100%   Breastfeeding Unknown   BMI 53.28 kg/m   Patient Vitals for the past 24 hrs:  BP Temp Temp src Pulse Resp SpO2 Height Weight  05/30/20 0957 (!) 117/48 98.4 F (36.9 C) Oral 92 16 -- -- --  05/30/20 0850 (!) 157/86 -- -- 98 -- -- -- --  05/30/20 0835 (!) 163/95 98.9 F (37.2 C) Oral 92 16 100 % -- --  05/30/20 0745 (!) 147/70 -- -- 92 16 -- -- --  05/30/20 0730 (!) 154/56 -- -- 92 14 -- -- --  05/30/20 0709 (!) 155/77 -- -- 89 -- -- -- --  05/30/20 0700 (!) 168/76 -- -- 91 16 -- -- --  05/30/20 0645 (!) 155/63 -- -- 91 18 -- -- --  05/30/20 0615 (!) 153/96 -- -- -- -- -- -- --  05/30/20 0530 (!) 146/61 -- -- 84 -- -- -- --  05/30/20 0500 (!) 151/62 -- -- 78 -- -- -- --  05/30/20 0435 (!) 151/40 -- -- 85 -- -- -- --  05/30/20 0400 (!) 156/76 -- -- 79 17 -- -- --  05/30/20 0300 (!) 124/55 98.4 F (36.9 C) Oral 83 -- -- -- --  05/30/20 0230 (!) 136/114 -- -- 93 -- -- -- --  05/30/20 0155 140/62 -- -- 77 -- 98 % -- --  05/30/20 0150 136/67 -- -- 77 -- 98 % -- --  05/30/20 0145 (!) 138/54 -- -- 74 -- 98 % -- --  05/30/20 0140 (!) 139/58 -- -- 77 -- 98 % -- --  05/30/20 0135 134/71 -- -- 81 -- 100 % -- --  05/30/20 0133 134/61 -- -- 85 -- -- -- --  05/30/20 0129 (!) 152/82 -- -- 83 -- -- -- --  05/30/20 0120 -- -- -- -- -- 99 % -- --  05/30/20 0010 -- 98.8 F (37.1 C) Oral -- -- -- -- --  05/29/20 2336 (!) 141/70 -- -- 86 -- -- -- --  05/29/20 2307 (!) 142/77 -- -- 89 -- -- -- --  05/29/20 2220 (!) 148/75 98.3 F (36.8 C) Oral 81 16 -- -- --  05/29/20 2055 125/65 -- -- 86 -- -- -- --  05/29/20 2030 -- 98 F (36.7 C) Oral -- -- -- -- --  05/29/20 2012 (!) 151/82 -- -- 88 17 -- -- --  05/29/20 1830 (!) 145/59 99 F (37.2 C) Oral 91  16 -- -- --  05/29/20 1635 (!) 145/76 -- -- 87 16 -- -- --  05/29/20 1530 131/70 -- -- 86 16 -- -- --  05/29/20 1435 120/60 -- -- (!) 105 16 -- -- --  05/29/20 1335 138/74 98 F (36.7 C) Oral (!) 114 18 -- 5\' 3"  (1.6 m) (!) 136.4 kg   CMP  CMP Latest Ref Rng & Units 05/30/2020    Glucose 70 - 99 mg/dL )    BUN 6 - 20 mg/dL 5(L)    Creatinine 07/28/2020 - 1.00 mg/dL 683(M    Sodium 1.96 - 2.22 mmol/L 137    Potassium 3.5 - 5.1 mmol/L 3.8    Chloride 98 - 111 mmol/L 105  CO2 22 - 32 mmol/L 19(L)    Calcium 8.9 - 10.3 mg/dL 9.6    Total Protein 6.5 - 8.1 g/dL 6.7(W)    Total Bilirubin 0.3 - 1.2 mg/dL 0.8    Alkaline Phos 38 - 126 U/L 104    AST 15 - 41 U/L 23    ALT 0 - 44 U/L 10     Urine P/C 0.14   Starting Procardia 30 mg XL, warning s/s diw/ pt and continue close monitoring.  V.Velia Pamer MD

## 2020-05-30 NOTE — Progress Notes (Addendum)
IOL 40.1 wks, for dates and BMI 53. S/p Cytotec 2 doses, pitocin was stopped after starting at 1x1 due to decels and tachysystole. Progressed well overnight.    Subjective: No complaints s/p epidural and resting well.   Objective: BP (!) 151/62   Pulse 78   Temp 98.4 F (36.9 C) (Oral)   Resp 17   Ht 5\' 3"  (1.6 m)   Wt (!) 136.4 kg   SpO2 98%   BMI 53.28 kg/m   FHR: 150 bpm, variability: moderate,  accelerations:  Present,  decelerations:  Absent UC:   regular, every 2 minutes SVE:   Dilation: 9 Effacement (%): 90 Station: 0 Exam by:: [email or web address] RN at 4.20 AM  Assessment / Plan: IOL, S/p cytotec x 2 doses. Progressed well. Assess and start pushing in 2nd stage Fetal Wellbeing:  Category I Anticipated MOD:  NSVD planned, EFW 7.1/2  lbs Elevated BPs without symptoms, none in severe range, has anxiety. Plan CMP, Uric acid and Urine P/C - Rn informed   Sherol Dade 05/30/2020, 5:33 AM

## 2020-05-30 NOTE — Anesthesia Preprocedure Evaluation (Signed)
Anesthesia Evaluation  Patient identified by MRN, date of birth, ID band Patient awake    Reviewed: Allergy & Precautions, Patient's Chart, lab work & pertinent test results  Airway Mallampati: III  TM Distance: >3 FB     Dental   Pulmonary Current Smoker,    Pulmonary exam normal        Cardiovascular negative cardio ROS Normal cardiovascular exam     Neuro/Psych negative neurological ROS     GI/Hepatic negative GI ROS, Neg liver ROS,   Endo/Other  Morbid obesity  Renal/GU negative Renal ROS     Musculoskeletal   Abdominal   Peds  Hematology negative hematology ROS (+)   Anesthesia Other Findings   Reproductive/Obstetrics (+) Pregnancy                             Anesthesia Physical Anesthesia Plan  ASA: III  Anesthesia Plan: Epidural   Post-op Pain Management:    Induction:   PONV Risk Score and Plan: Treatment may vary due to age or medical condition  Airway Management Planned: Natural Airway  Additional Equipment:   Intra-op Plan:   Post-operative Plan:   Informed Consent: I have reviewed the patients History and Physical, chart, labs and discussed the procedure including the risks, benefits and alternatives for the proposed anesthesia with the patient or authorized representative who has indicated his/her understanding and acceptance.       Plan Discussed with:   Anesthesia Plan Comments:         Anesthesia Quick Evaluation

## 2020-05-30 NOTE — Social Work (Signed)
CSW attempted to meet with MOB to complete assessment. CSW observed FOB on couch sleeping, MOB appearing sleep and baby in bassinet. CSW will follow-up with MOB tomorrow.  Taliesin Hartlage, MSW, LCSWA Clinical Social Work [facility] [phone] 

## 2020-05-30 NOTE — Anesthesia Procedure Notes (Signed)
Epidural Patient location during procedure: OB Start time: 05/30/2020 1:20 AM End time: 05/30/2020 1:26 AM  Preanesthetic Checklist Completed: patient identified, IV checked, site marked, risks and benefits discussed, surgical consent, monitors and equipment checked, pre-op evaluation and timeout performed  Epidural Patient position: sitting Prep: DuraPrep and site prepped and draped Patient monitoring: continuous pulse ox and blood pressure Approach: midline Location: L3-L4 Injection technique: LOR air  Needle:  Needle type: Tuohy  Needle gauge: 17 G Needle length: 9 cm and 9 Needle insertion depth: 8 cm Catheter type: closed end flexible Catheter size: 19 Gauge Catheter at skin depth: 16 cm Test dose: negative  Assessment Events: blood not aspirated, injection not painful, no injection resistance, no paresthesia and negative IV test

## 2020-05-30 NOTE — Lactation Note (Signed)
This note was copied from a baby's chart. Lactation Consultation Note  Patient Name: Sonya Brooks ZWCHE'N Date: 05/30/2020 Reason for consult: Initial assessment Age:33 Hours  LC arrived in mothers room . [facility] brochure given along with basic teaching done. Mother reports that her nipples are inverted and that she has been using a NS.  Mother is very sleepy, but she reports that she knows her infant is hungry. She wants to try to breastfeed. Infant placed in football hold.   Multiple attempts to get infant latched . He can pull mothers nipple to the surface but is unable to get in his mouth without nipple inverting again. Mother complains of pain when infant latching on and chomping on the base nipple.   Attempt with the #24 NS and infant is only able to get the tip of the nipple shield and clamp down without any nipple in shield. Observed saliva in the shield.   Mother was given a hand pump to pump nipple out and to pump for 15 mins on each  Breast but mother fell asleep with hand pump in her hand .  Husband to get mothers bra out so nurse or myself could put shells on   Discussed use a DEBP, and possible use of DBM.  Mother very sleepy and will discuss again with mother . Father also left the room to get something to eat. Will discuss with father when he returns.  LC gave report to [name] , pts staff nurse.   Maternal Data Has patient been taught Hand Expression?: Yes Does the patient have breastfeeding experience prior to this delivery?: No  Feeding Feeding Type: Breast Fed  LATCH Score Latch: Repeated attempts needed to sustain latch, nipple held in mouth throughout feeding, stimulation needed to elicit sucking reflex.  Audible Swallowing: None  Type of Nipple: Inverted  Comfort (Breast/Nipple): Soft / non-tender  Hold (Positioning): Full assist, staff holds infant at breast  LATCH Score: 3  Interventions Interventions: Breast feeding basics reviewed;Assisted  with latch;Skin to skin;Hand express;Pre-pump if needed;Adjust position;Support pillows;Shells;DEBP;Hand pump  Lactation Tools Discussed/Used Tools: Nipple Shields Nipple shield size: 24 WIC Program: No Pump Education: Setup, frequency, and cleaning;Milk Storage Initiated by:: Aspyn Warnke RN,IBCLC Date initiated:: 05/30/20   Consult Status Consult Status: Follow-up Date: 05/30/20 Follow-up type: In-patient    Stevan Born [facility] 05/30/2020, 11:23 AM

## 2020-05-30 NOTE — Anesthesia Postprocedure Evaluation (Signed)
Anesthesia Post Note  Patient: Sonya Brooks  Procedure(s) Performed: AN AD HOC LABOR EPIDURAL     Patient location during evaluation: Mother Baby Anesthesia Type: Epidural Level of consciousness: awake and alert Pain management: pain level controlled Vital Signs Assessment: post-procedure vital signs reviewed and stable Respiratory status: spontaneous breathing, nonlabored ventilation and respiratory function stable Cardiovascular status: stable Postop Assessment: no headache, no backache and epidural receding Anesthetic complications: no   No complications documented.  Last Vitals:  Vitals:   05/30/20 0957 05/30/20 1345  BP: (!) 117/48 (!) 122/59  Pulse: 92 85  Resp: 16 17  Temp: 36.9 C 37 C  SpO2:      Last Pain:  Vitals:   05/30/20 1345  TempSrc: Oral  PainSc:    Pain Goal:                   Rica Records

## 2020-05-30 NOTE — Lactation Note (Signed)
This note was copied from a baby's chart. Lactation Consultation Note Baby less than 1 hr old. Mom holding baby STS. Baby alert suckling on his hand.  Mom has inverted nipples. Edema noted to breast and areolas.  Massage around nipple to soften tissue. Attempted to latch to Lt. Nipple after much stimulation in laid back position. Sat mom sup slightly d/t baby kept falling into mom's breast. Hand expression attempted. No colostrum noted.  Mom will need shells and pump.. probably #24 NS.  Patient Name: Sonya Brooks Date: 05/30/2020 Reason for consult: L&D Initial assessment;Term Age:62 hours  Maternal Data    Feeding    LATCH Score Latch: Too sleepy or reluctant, no latch achieved, no sucking elicited. (no latch achieved)  Audible Swallowing: None  Type of Nipple: Inverted  Comfort (Breast/Nipple): Filling, red/small blisters or bruises, mild/mod discomfort (some edema noted)  Hold (Positioning): Full assist, staff holds infant at breast  LATCH Score: 1  Interventions Interventions: Adjust position;Assisted with latch;Skin to skin;Breast massage;Hand express;Reverse pressure;Breast compression  Lactation Tools Discussed/Used     Consult Status Consult Status: Follow-up Date: 05/30/20 Follow-up type: In-patient    Charyl Dancer 05/30/2020, 6:43 AM

## 2020-05-30 NOTE — Lactation Note (Addendum)
This note was copied from a baby's chart. Lactation Consultation Note  Patient Name: Sonya Brooks Date: 05/30/2020 Reason for consult: Follow-up assessment;Mother's request;Difficult latch;Primapara;1st time breastfeeding;Term Age:32 hours  Infant not able to latch. Mom did breast massage and hand expression but only beads of colostrum noted. Mom used DEBP x1 for 15 minutes. Also, she used the manual pump but no colostrum noted. Mom concerned infant is not latching and she has no colostrum to spoon feed.   LC placed infant STS and tried latching at the breast. Nipples are everted and compressible. Infant not able to latch. LC did some suck training infant not able to extend the tongue and biting with gums. LC examined oral cavity and infant does not have a heart shape tongue.   Mom desired to see infant fed. We went over the option of using DBM and she consented. LC primed 24 NS with 0.5 ml of DBM. Infant but he would not grasp on to the NS.   LC then spoon fed infant DBP 1.5 ml. Infant then fell back to sleep. LC informed Mom infant still under 24 hours old and needs more time.   Mom to pump using DEBP q 3 hours for 15 minutes to increase stimulation and letdown.  DEBP set up with Mom. LC reviewed milk storage and DBM storage.   DBM consent obtained and placed in the infant's chart.  RN to scan [facility] into the system.  Plan 1. Mom to feed based on cues 8-12x in 24 hour period no more than 3 hours without an attempt.  Mom to place infant STS and look for signs of milk transfer. Mom 24 NS and shown how to prime 24 NS with DBM before latching.           2. Breastfeeding supplementation guide reviewed with her based on current age and hours after birth.           3. DEBP q 3 hours for 15 minutes.          Mom to call for assistance for next feeding.

## 2020-05-31 DIAGNOSIS — O139 Gestational [pregnancy-induced] hypertension without significant proteinuria, unspecified trimester: Secondary | ICD-10-CM

## 2020-05-31 LAB — CBC
HCT: 36.1 % (ref 36.0–46.0)
Hemoglobin: 11.4 g/dL — ABNORMAL LOW (ref 12.0–15.0)
MCH: 25.3 pg — ABNORMAL LOW (ref 26.0–34.0)
MCHC: 31.6 g/dL (ref 30.0–36.0)
MCV: 80 fL (ref 80.0–100.0)
Platelets: 212 10*3/uL (ref 150–400)
RBC: 4.51 MIL/uL (ref 3.87–5.11)
RDW: 15 % (ref 11.5–15.5)
WBC: 10.5 10*3/uL (ref 4.0–10.5)
nRBC: 0 % (ref 0.0–0.2)

## 2020-05-31 NOTE — Lactation Note (Addendum)
This note was copied from a baby's chart. Lactation Consultation Note  Patient Name: Sonya Brooks [place] [id number] Date: 05/31/2020 Reason for consult: Follow-up assessment;Mother's request;Difficult latch;Primapara;1st time breastfeeding;Term Age:33 hours  Infant is struggling with latching given Mom's inverted nipples. Nipples will come out with pre pumping or stimulation. Infant also using a pacifier that may be impacting his ability to latch due to nipple confusion. LC applied 24 NS which he will latch but not sustain. LC then primed NS with 0.44ml of formula x2 and he latches and transfers but not long.   LC reviewed feeding based on breastfeeding supplementation guide. Mom to try latching with NS and taught how to use the curve tip syringe to assist with the latch priming with formula to initiate the suck.   Dad taught how to paced bottle feed. Infant had larger volumes today that he could not tolerate. LC demonstrated to parents how to pour out volume for feed in smaller bottles from the pump. To offer the EBM first and then formula, breaking to burp him in between.   Mom to pump using DEBP q 3hours for 15 minutes.  Mom provided with coconut oil for nipple care. Mom denies any pain with the latch or with pumping. Mom to use breast shells when she is not pumping, sleeping or nursing.   Mom states she has been pumping 3x times and still only collecting small amounts of EBM. Mom encouraged to continue stimulation with hand expression, breast massage and pumping using the DEBP.   LC reviewed plan with family and the understood.  All questions answered. LC had to reinforce infant does not sleep through the night, to feed him based on cues and not to go more than 3 hours without an attempt.

## 2020-05-31 NOTE — Lactation Note (Signed)
This note was copied from a baby's chart. Lactation Consultation Note  Patient Name: Sonya Brooks Date: 05/31/2020   Age:33 hours  LC went in to see how Mom and baby are doing. Dad in the room stating Mom is taking a shower. LC told family members to call when Mom is out of the shower before the next feeding.

## 2020-05-31 NOTE — Social Work (Signed)
CSW attempted to meet with MOB. CSW observed FOB on couch sleeping and MOB reported she was also asleep. CSW agreed to return later in the day.   Manfred Arch, MSW, [employer] Clinical Social Work [employer] [facility] [employer] [phone]

## 2020-05-31 NOTE — Progress Notes (Signed)
PPD # 1 S/P NSVD  Live born female  Birth Weight: 6 lb 11.6 oz (3050 g) APGAR: 9, 9  Newborn Delivery   Birth date/time: 05/30/2020 05:54:00 Delivery type: Vaginal, Spontaneous     Baby name: Sonya Brooks Delivering provider: MODY, VAISHALI  Episiotomy:None   Lacerations:1st degree   circumcision planned  Feeding: breast and bottle  Pain control at delivery: Epidural   S:  Reports feeling tired and sore.             Tolerating po/ No nausea or vomiting             Bleeding is normal, denies clots.             Pain controlled with ibuprofen (OTC)             Up ad lib / ambulatory / voiding without difficulties   O:  A & O x 3, in no apparent distress              VS:  Vitals:   05/30/20 1345 05/30/20 1837 05/30/20 2300 05/31/20 0510  BP: (!) 122/59 135/68 140/84 127/73  Pulse: 85 87 84 88  Resp: 17 16 20 16   Temp: 98.6 F (37 C) 98 F (36.7 C) 97.9 F (36.6 C) 98 F (36.7 C)  TempSrc: Oral Oral Oral Oral  SpO2:   100% 99%  Weight:      Height:        LABS:  Recent Labs    05/29/20 1336 05/31/20 0503  WBC 8.8 10.5  HGB 13.4 11.4*  HCT 41.4 36.1  PLT 258 212    Blood type: --/--/AB POS (01/02 1336)  Rubella: Immune (06/01 0000)   I&O: I/O last 3 completed shifts: In: -  Out: 950 [Urine:300; Blood:650]          No intake/output data recorded.  Vaccines: TDaP          UTD         Flu             UTD                    COVID-19 declined  Gen: AAO x 3, NAD, flat affect  Abdomen: soft, non-tender, non-distended             Fundus: firm, non-tender, U@  Perineum: repair intact  Lochia: small  Extremities: trace pedal edema, no calf pain or tenderness    A/P: PPD # 1 34 y.o., 02-09-1986   Principal Problem:   Postpartum care following vaginal delivery 1/3 Active Problems:   Encounter for induction of labor   SVD (spontaneous vaginal delivery) 1/3   BMI 50.0-59.9, adult (HCC)   Anxiety and depression  - at increased risk for PPD, recc starting SSRI prior to  DC  - social services consult pending   Insufficient social support   First degree perineal laceration   Gestational hypertension  - labile BP, no neural sx  - Procardia 30XL started yesterday, labs wnl  Doing well - stable status  Routine post partum orders  Anticipate discharge tomorrow    09-23-1994, MSN, CNM 05/31/2020, 9:35 AM

## 2020-05-31 NOTE — Social Work (Signed)
CSW received consult for hx of Anxiety and Depression.  CSW met with MOB to offer support and complete assessment.     CSW introduced self and role. CSW observed FOB sitting in chair holding baby Qadir and MOB on bed. CSW asked to speak with MOB alone to respect HIPAA. FOB was understanding. MOB was pleasant and engaged with CSW. MOB was appropriate with interactions with baby. CSW informed MOB of the reason for consult. MOB reported a diagnosis of anxiety and depression. MOB stated she is unsure of when she was first diagnosed, but recalls having bad symptoms in 2019 after experiencing a miscarriage. MOB stated she attended therapy in [place] and was prescribed Tramadol and Sertraline to cope. MOB reported she found the medications to be helpful. CSW and MOB discussed how the current pregnancy was for MOB. MOB reported she had a stressful pregnancy due to going through a break-up with FOB, working and also attending school. MOB stated FOB has been supportive postpartum. CSW asked MOB who her supports are, MOB reported "Jesus" while laughing. CSW expressed understanding and asked MOB if she has any physical supports. MOB reported she would generally consider her mother to be a support, but her mother is currently going through a divorce. MOB was observed becoming tearful while stating she lives alone and is trying to adjust to the changes. CSW was empathetic and understanding. CSW asked MOB if she would like a referral for [facility], which could provide support and resources. MOB was receptive and in agreement. CSW asked MOB if she is currently feeling depressive symptoms or feelings of anxiousness.  MOB deferred and attributed tearfullness to being really tired and being frustrated with challenges in reference to breast feeding. CSW asked MOB if she would like to discuss possibly starting medication to address depression, with the MD. MOB declined and reported previously discussing going to [facility]  counseling, with her MD. MOB stated with so much going on, she has not followed up with it. CSW encouraged MOB to reach out to the counseling agency, or another agency on the resource list. MOB was in agreement. MOB denies any current SI, HI or being involved in any DV.  CSW provided education regarding the baby blues period vs. perinatal mood disorders, discussed treatment and gave resources for mental health follow up if concerns arise.  CSW recommends self-evaluation during the postpartum time period using the New Mom Checklist from Postpartum Progress and encouraged MOB to contact a medical professional if symptoms are noted at any time.   CSW provided review of Sudden Infant Death Syndrome (SIDS) precautions.  MOB reported baby will sleep in a packn'play. MOB stated she has all of the essential needs for baby to discharge home, including a car seat. MOB is still in the process of choosing a pediatrician and denies any transportation barriers. MOB declines having any additional needs at this time.   CSW made a referral to [facility]. CSW identifies no further need for intervention and no barriers to discharge at this time.  Juandavid Dallman, LCSWA Clinical Social Work [facility] [phone] 

## 2020-06-01 MED ORDER — IBUPROFEN 600 MG PO TABS: 600.0000 mg | ORAL_TABLET | Freq: Four times a day (QID) | ORAL | 0 refills | Status: AC

## 2020-06-01 MED ORDER — NIFEDIPINE ER OSMOTIC RELEASE 30 MG PO TB24: 30.0000 mg | ORAL_TABLET | Freq: Two times a day (BID) | ORAL | 1 refills | Status: AC

## 2020-06-01 MED ORDER — COCONUT OIL OIL: 1.0000 "application " | TOPICAL_OIL | 0 refills | Status: AC | PRN

## 2020-06-01 MED ORDER — BENZOCAINE-MENTHOL 20-0.5 % EX AERO: 1.0000 "application " | INHALATION_SPRAY | CUTANEOUS | Status: AC | PRN

## 2020-06-01 MED ORDER — SERTRALINE HCL 50 MG PO TABS: 50.0000 mg | ORAL_TABLET | Freq: Every day | ORAL | 2 refills | Status: AC

## 2020-06-01 MED ORDER — ACETAMINOPHEN 500 MG PO TABS: 1000.0000 mg | ORAL_TABLET | Freq: Four times a day (QID) | ORAL | 2 refills | Status: AC | PRN

## 2020-06-01 MED ORDER — SENNOSIDES-DOCUSATE SODIUM 8.6-50 MG PO TABS: 2.0000 | ORAL_TABLET | Freq: Every day | ORAL | Status: AC

## 2020-06-01 NOTE — Discharge Instructions (Signed)
Lactation outpatient support - home visit  Linda Coppola RN, MHA, IBCLC at [facility]: Lactation Consultant  [email or web address] Mail: [email or web address] Tel: [phone]    Additional resources:  [facility] [email or web address]   Chiropractic specialist   Dr. Leanna Hastings [email or web address]  Craniosacral therapy for baby  Erin Balkind  [email or web address]  

## 2020-06-01 NOTE — Progress Notes (Signed)
Post Partum Day #2  S/P NSVB Live born female  Birth Weight: 6 lb 11.6 oz (3050 g) APGAR: 9, 9  Newborn Delivery   Birth date/time: 05/30/2020 05:54:00 Delivery type: Vaginal, Spontaneous     Named Qadir Delivering provider: MODY, VAISHALI   circumcision planned before discharge Feeding: breast and bottle  Pain control at delivery: Epidural   Subjective: No HA, SOB, CP, breast symptoms.  Pain in perineum improved with motrin. .  Normal vaginal bleeding, no clots.   Voiding freely.   Reports hx anxiety/depression previously treated w/ sertraline 50 mg and Trazadone. 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 Has her mother for support but minimal. Also demands of working and going to school are causing increased stress. Appears overwhelmed and very tired, easily teary.  Discussed benefit of restarting SSRI and agrees.     Objective:  VS:  Vitals:   05/31/20 1012 05/31/20 1641 05/31/20 2105 06/01/20 0512  BP: 126/68 (!) 150/84 (!) 146/67 (!) 141/76  Pulse: 84 81 85 88  Resp: 18 16 17 18   Temp:  98 F (36.7 C) 97.7 F (36.5 C) 98 F (36.7 C)  TempSrc:  Oral Oral Oral  SpO2:  99% 100% 98%  Weight:      Height:        No intake or output data in the 24 hours ending 06/01/20 1121    Recent Labs    05/29/20 1336 05/31/20 0503  WBC 8.8 10.5  HGB 13.4 11.4*  HCT 41.4 36.1  PLT 258 212    Blood type: --/--/AB POS (01/02 1336) Rubella: Immune (06/01 0000)  Vaccines: TDaP          UTD         Flu             UTD                    COVID-19 declined  Physical Exam:  General: AAO x 3, NAD Uterine Fundus: firm Lochia: appropriate Perineum: repair intact, no edema DVT Evaluation: No cords or calf tenderness. No significant calf/ankle edema.    Assessment/Plan: PPD # 2 / 35 y.o., 33    Principal Problem:   Postpartum care following vaginal delivery  1/3 Active Problems:   Encounter for induction of labor   SVD (spontaneous vaginal delivery) 1/3   BMI 50.0-59.9, adult (HCC)   Anxiety and depression  - social service consult completed, f/u w/ [facility] planned  - Start Zoloft 50 mg daily, onboard 25 mg first week to decrease side effect.     Insufficient social support   First degree perineal laceration   Gestational hypertension  - BP remains in mild range elevation, no neural sx  - increase Procardia 30 XL BID    normal postpartum exam  Continue current postpartum care             DC home today w/ instructions  F/U at [facility] in 1 week for BP check and 2 wks for PPD risk.    LOS: 3 days   [facility], CNM, MSN 06/01/2020, 11:21 AM

## 2020-06-01 NOTE — Discharge Summary (Signed)
OB Discharge Summary  Patient Name: Sonya Brooks DOB: January 31, 1986 MRN: [id number]  Date of admission: 05/29/2020 Delivering provider: MODY, VAISHALI   Admitting diagnosis: Encounter for induction of labor [Z34.90] SVD (spontaneous vaginal delivery) [O80] Intrauterine pregnancy: [redacted]w[redacted]d     Secondary diagnosis: Patient Active Problem List   Diagnosis Date Noted  . Gestational hypertension 05/31/2020  . SVD (spontaneous vaginal delivery) 1/3 05/30/2020  . Postpartum care following vaginal delivery 1/3 05/30/2020  . BMI 50.0-59.9, adult (HCC) 05/30/2020  . Anxiety and depression 05/30/2020  . Insufficient social support 05/30/2020  . First degree perineal laceration 05/30/2020  . Encounter for induction of labor 05/29/2020   Additional problems:none   Date of discharge: 06/01/2020   Discharge diagnosis: Principal Problem:   Postpartum care following vaginal delivery 1/3 Active Problems:   Encounter for induction of labor   SVD (spontaneous vaginal delivery) 1/3   BMI 50.0-59.9, adult (HCC)   Anxiety and depression   Insufficient social support   First degree perineal laceration   Gestational hypertension                                                              Post partum procedures:none  Augmentation: AROM, Pitocin and Cytotec Pain control: Epidural  Laceration:1st degree  Episiotomy:None  Complications: None  Hospital course:  Induction of Labor With Vaginal Delivery   35 y.o. yo [id number] at [redacted]w[redacted]d was admitted to the hospital 05/29/2020 for induction of labor.  Indication for induction: Maternal obesity, depression/ anxiety, poor family support, FOB in picture but not available most days since truck driver.  Patient had an uncomplicated labor course as follows: Membrane Rupture Time/Date: 5:48 AM ,05/30/2020   Delivery Method:Vaginal, Spontaneous  Episiotomy: None  Lacerations:  1st degree  Details of delivery can be found in separate delivery note.  Patient had a  routine postpartum course. Patient is discharged home 06/01/20.  Newborn Data: Birth date:05/30/2020  Birth time:5:54 AM  Gender:Female  Living status:Living  Apgars:9 ,9  Weight:3050 g   Physical exam  Vitals:   05/31/20 1012 05/31/20 1641 05/31/20 2105 06/01/20 0512  BP: 126/68 (!) 150/84 (!) 146/67 (!) 141/76  Pulse: 84 81 85 88  Resp: 18 16 17 18   Temp:  98 F (36.7 C) 97.7 F (36.5 C) 98 F (36.7 C)  TempSrc:  Oral Oral Oral  SpO2:  99% 100% 98%  Weight:      Height:       General: alert, cooperative and no distress Lochia: appropriate Uterine Fundus: firm Incision: N/A Perineum: repair intact, no edema DVT Evaluation: No cords or calf tenderness. Calf/Ankle edema is present Labs: Lab Results  Component Value Date   WBC 10.5 05/31/2020   HGB 11.4 (L) 05/31/2020   HCT 36.1 05/31/2020   MCV 80.0 05/31/2020   PLT 212 05/31/2020   CMP Latest Ref Rng & Units 05/30/2020  Glucose 70 - 99 mg/dL 07/28/2020)  BUN 6 - 20 mg/dL 5(L)  Creatinine 947(S - 1.00 mg/dL 9.62  Sodium 8.36 - 629 mmol/L 137  Potassium 3.5 - 5.1 mmol/L 3.8  Chloride 98 - 111 mmol/L 105  CO2 22 - 32 mmol/L 19(L)  Calcium 8.9 - 10.3 mg/dL 9.6  Total Protein 6.5 - 8.1 g/dL 476)  Total Bilirubin 0.3 - 1.2 mg/dL  0.8  Alkaline Phos 38 - 126 U/L 104  AST 15 - 41 U/L 23  ALT 0 - 44 U/L 10   No flowsheet data found. Vaccines: TDaP          UTD         Flu             UTD                    COVID-19 declined  Discharge instruction:  per After Visit Summary,  [facility] booklet and  "Understanding Mother & Baby Care" hospital booklet  After Visit Meds:  Allergies as of 06/01/2020   No Known Allergies     Medication List    TAKE these medications   acetaminophen 500 MG tablet Commonly known as: TYLENOL Take 2 tablets (1,000 mg total) by mouth every 6 (six) hours as needed.   benzocaine-Menthol 20-0.5 % Aero Commonly known as: DERMOPLAST Apply 1 application topically as needed for irritation  (perineal discomfort).   coconut oil Oil Apply 1 application topically as needed.   ibuprofen 600 MG tablet Commonly known as: ADVIL Take 1 tablet (600 mg total) by mouth every 6 (six) hours.   NIFEdipine 30 MG 24 hr tablet Commonly known as: PROCARDIA-XL/NIFEDICAL-XL Take 1 tablet (30 mg total) by mouth 2 (two) times daily.   prenatal multivitamin Tabs tablet Take by mouth daily at 12 noon.   senna-docusate 8.6-50 MG tablet Commonly known as: Senokot-S Take 2 tablets by mouth daily. Start taking on: June 02, 2020   sertraline 50 MG tablet Commonly known as: Zoloft Take 1 tablet (50 mg total) by mouth daily. Start taking 1/2 tab daily first week then increase to 1 tab daily thereafter.            Discharge Care Instructions  (From admission, onward)         Start     Ordered   06/01/20 0000  Discharge wound care:       Comments: Sitz baths 2 times /day with warm water x 1 week. May add herbals: 1 ounce dried comfrey leaf* 1 ounce calendula flowers 1 ounce lavender flowers  Supplies can be found online at [employer] sources at [employer], [employer]  1/2 ounce dried uva ursi leaves 1/2 ounce witch hazel blossoms (if you can find them) 1/2 ounce dried sage leaf 1/2 cup sea salt Directions: Bring 2 quarts of water to a boil. Turn off heat, and place 1 ounce (approximately 1 large handful) of the above mixed herbs (not the salt) into the pot. Steep, covered, for 30 minutes.  Strain the liquid well with a fine mesh strainer, and discard the herb material. Add 2 quarts of liquid to the tub, along with the 1/2 cup of salt. This medicinal liquid can also be made into compresses and peri-rinses.   06/01/20 1133          Diet: routine diet  Activity: Advance as tolerated. Pelvic rest for 6 weeks.   Postpartum contraception: Not Discussed  Newborn Data: Live born female  Birth Weight: 6 lb 11.6 oz (3050 g) APGAR: 9, 9  Newborn Delivery   Birth  date/time: 05/30/2020 05:54:00 Delivery type: Vaginal, Spontaneous      named Qadir Baby Feeding: Bottle and Breast Disposition:home with mother   Delivery Report:  Review the Delivery Report for details.    Follow up:  Follow-up Information    Azucena Fallen, MD. Schedule  an appointment as soon as possible for a visit in 1 week(s).   Specialty: Obstetrics and Gynecology Why: For BP check F/U in 2 weeks for mood assessment.  Contact information: [address] [place] [address] [phone]                 Signed: Neta Mends, CNM, MSN 06/01/2020, 11:34 AM

## 2020-06-01 NOTE — Lactation Note (Signed)
This note was copied from a baby's chart. Lactation Consultation Note  Patient Name: Sonya Brooks [id number] Date: 06/01/2020 Reason for consult: Follow-up assessment;Difficult latch;1st time breastfeeding;Term Age:33 hours  As LC entered the room mom asleep, dad sitting in the chair falling asleep.  LC gently mentioned to dad the baby should be in the crib if he felt he couldn't stay awake.  LC checked and changed the diaper ( wet ) and baby unsettled and rooting.  LC had dad feed formula due to mom being so tired with a HA.  LC reassessed mom for the NS and neither nipple was a candidate for #16 NS ,  Right nipple not a candidate for NS to fit properly and the left borderline for #24 NS.  LC recommended to mom shells while awake between feedings.  And consistent pumping around the clock 8- 10 times for 15 mins.  When he left nipple is more erect and the NS fits more properly , work on latching.  Sore nipple and engorgement prevention and tx reviewed.  Mom has a hand pump, a DEBP kit, #24 NS  and shells for D/C.  Per mom has a DEBP at home.  LC offered to request a LC O/P appt and mom receptive.  LC placed a request in the Epic basket.  Mom aware the [facility] O/P clinic will call her.    Maternal Data    Feeding Feeding Type: Formula Nipple Type: Slow - flow  LATCH Score                   Interventions Interventions: Breast feeding basics reviewed  Lactation Tools Discussed/Used Tools: Nipple [place] [employer];Shells;Coconut oil (LC resized NS / right nipple not a candidate for NS. left nipple - #24 NS - borderline) Nipple shield size: 24;Other (comment) Shell Type: Inverted   Consult Status Consult Status: Complete Follow-up type: Out-patient    [place] 06/01/2020, 9:26 AM

## 2021-02-25 ENCOUNTER — Encounter: Payer: Self-pay | Admitting: Radiology

## 2023-08-29 ENCOUNTER — Ambulatory Visit
Admission: EM | Admit: 2023-08-29 | Discharge: 2023-08-29 | Disposition: A | Discharge status: Home / Self Care | Attending: Internal Medicine | Admitting: Internal Medicine

## 2023-08-29 ENCOUNTER — Encounter: Payer: Self-pay | Admitting: Emergency Medicine

## 2023-08-29 DIAGNOSIS — J069 Acute upper respiratory infection, unspecified: Secondary | ICD-10-CM | POA: Diagnosis not present

## 2023-08-29 LAB — POC COVID19/FLU A&B COMBO
Covid Antigen, POC: NEGATIVE
Influenza A Antigen, POC: NEGATIVE
Influenza B Antigen, POC: NEGATIVE

## 2023-08-29 MED ORDER — GUAIFENESIN ER 1200 MG PO TB12: 1200.0000 mg | ORAL_TABLET | Freq: Two times a day (BID) | ORAL | 0 refills | Status: AC

## 2023-08-29 MED ORDER — PROMETHAZINE-DM 6.25-15 MG/5ML PO SYRP: 5.0000 mL | ORAL_SOLUTION | Freq: Every evening | ORAL | 0 refills | Status: AC | PRN

## 2023-08-29 NOTE — ED Triage Notes (Signed)
 Pt c/o sore throat, headache, sinus pressure, bilateral ear pressure that began Monday and has gotten worse.   She has tried dayquil, Careers adviser, nasal spray at home

## 2023-08-29 NOTE — Discharge Instructions (Signed)

## 2023-09-01 NOTE — ED Provider Notes (Signed)
 [facility]    CSN: [id number] Arrival date & time: 08/29/23  1918      History   Chief Complaint Chief Complaint  Patient presents with   Sore Throat    HPI [place] Sony[facility] is a 38 y.o. female.   [place] Sonya Preast is a 38 y.o. female presenting for chief complaint of sore throat, bilateral ear pressure, headache, nasal congestion, and cough that started 3 days ago. Cough is dry. No recent sick contacts to her knowledge. Denies N/V/D, abdominal pain, rash, CP, SOB, fever/chills, and palpitations. Current everyday cigarette smoker, denies other drug use. Denies history of chronic respiratory problems. Taking OTC medications with some relief.    Sore Throat    History reviewed. No pertinent past medical history.  Patient Active Problem List   Diagnosis Date Noted   Gestational hypertension 05/31/2020   SVD (spontaneous vaginal delivery) 1/3 05/30/2020   Postpartum care following vaginal delivery 1/3 05/30/2020   BMI 50.0-59.9, adult (HCC) 05/30/2020   Anxiety and depression 05/30/2020   Insufficient social support 05/30/2020   First degree perineal laceration 05/30/2020   Encounter for induction of labor 05/29/2020    Past Surgical History:  Procedure Laterality Date   CHOLECYSTECTOMY     WISDOM TOOTH EXTRACTION      OB History     Gravida  3   Para  1   Term  1   Preterm      AB  2   Living  1      SAB  2   IAB      Ectopic      Multiple  0   Live Births  1            Home Medications    Prior to Admission medications   Medication Sig Start Date End Date Taking? Authorizing Provider  Guaifenesin 1200 MG TB12 Take 1 tablet (1,200 mg total) by mouth in the morning and at bedtime. 08/29/23  Yes Carlisle Beers, FNP  promethazine-dextromethorphan (PROMETHAZINE-DM) 6.25-15 MG/5ML syrup Take 5 mLs by mouth at bedtime as needed for cough. 08/29/23  Yes Carlisle Beers, FNP  benzocaine-Menthol (DERMOPLAST) 20-0.5 % AERO  Apply 1 application topically as needed for irritation (perineal discomfort). 06/01/20   Neta Mends, CNM  coconut oil OIL Apply 1 application topically as needed. 06/01/20   Neta Mends, CNM  ibuprofen (ADVIL) 600 MG tablet Take 1 tablet (600 mg total) by mouth every 6 (six) hours. 06/01/20   Neta Mends, CNM  NIFEdipine (PROCARDIA-XL/NIFEDICAL-XL) 30 MG 24 hr tablet Take 1 tablet (30 mg total) by mouth 2 (two) times daily. 06/01/20   Neta Mends, CNM  Prenatal Vit-Fe Fumarate-FA (PRENATAL MULTIVITAMIN) TABS tablet Take by mouth daily at 12 noon.    [provider]  senna-docusate (SENOKOT-S) 8.6-50 MG tablet Take 2 tablets by mouth daily. 06/02/20   Neta Mends, CNM  sertraline (ZOLOFT) 50 MG tablet Take 1 tablet (50 mg total) by mouth daily. Start taking 1/2 tab daily first week then increase to 1 tab daily thereafter. 06/01/20 08/30/20  Neta Mends, CNM  famotidine (PEPCID) 40 MG tablet Take 40 mg by mouth daily.  05/30/20  [provider]  Norgestim-Eth Charlott Holler Triphasic (ORTHO TRI-CYCLEN, 28, PO) Take 1 tablet by mouth daily.  05/30/20  [provider]    Family History Family History  Problem Relation Age of Onset   Heart disease Father    Hypertension Maternal  Grandmother    Diabetes Maternal Grandmother    Hypertension Maternal Grandfather    Diabetes Maternal Grandfather    Stroke Maternal Grandfather    Hypertension Paternal Grandmother    Diabetes Paternal Grandmother    Hypertension Paternal Grandfather    Diabetes Paternal Grandfather    Heart disease Paternal Grandfather     Social History Social History   Tobacco Use   Smoking status: Every Day   Smokeless tobacco: Never  Substance Use Topics   Alcohol use: Yes   Drug use: No     Allergies   Patient has no known allergies.   Review of Systems Review of Systems Per HPI  Physical Exam Triage Vital Signs ED Triage Vitals  Encounter Vitals Group     BP 08/29/23 1931 122/78      Systolic BP Percentile --      Diastolic BP Percentile --      Pulse Rate 08/29/23 1931 80     Resp 08/29/23 1931 16     Temp 08/29/23 1931 98 F (36.7 C)     Temp Source 08/29/23 1931 Oral     SpO2 08/29/23 1931 94 %     Weight --      Height --      Head Circumference --      Peak Flow --      Pain Score 08/29/23 1936 10     Pain Loc --      Pain Education --      Exclude from Growth Chart --    No data found.  Updated Vital Signs BP 122/78 (BP Location: Right Arm)   Pulse 80   Temp 98 F (36.7 C) (Oral)   Resp 16   LMP 06/29/2023 (Approximate)   SpO2 94%   Visual Acuity Right Eye Distance:   Left Eye Distance:   Bilateral Distance:    Right Eye Near:   Left Eye Near:    Bilateral Near:     Physical Exam Vitals and nursing note reviewed.  Constitutional:      Appearance: She is not ill-appearing or toxic-appearing.  HENT:     Head: Normocephalic and atraumatic.     Right Ear: Hearing, tympanic membrane, ear canal and external ear normal.     Left Ear: Hearing, tympanic membrane, ear canal and external ear normal.     Nose: Congestion present.     Mouth/Throat:     Lips: Pink.     Mouth: Mucous membranes are moist. No injury or oral lesions.     Dentition: Normal dentition.     Tongue: No lesions.     Pharynx: Oropharynx is clear. Uvula midline. No pharyngeal swelling, oropharyngeal exudate, posterior oropharyngeal erythema, uvula swelling or postnasal drip.     Tonsils: No tonsillar exudate.  Eyes:     General: Lids are normal. Vision grossly intact. Gaze aligned appropriately.     Extraocular Movements: Extraocular movements intact.     Conjunctiva/sclera: Conjunctivae normal.  Neck:     Trachea: Trachea and phonation normal.  Cardiovascular:     Rate and Rhythm: Normal rate and regular rhythm.     Heart sounds: Normal heart sounds, S1 normal and S2 normal.  Pulmonary:     Effort: Pulmonary effort is normal. No respiratory distress.     Breath  sounds: Normal breath sounds and air entry.  Musculoskeletal:     Cervical back: Neck supple.  Lymphadenopathy:     Cervical: No cervical adenopathy.  Skin:  General: Skin is warm and dry.     Capillary Refill: Capillary refill takes less than 2 seconds.     Findings: No rash.  Neurological:     General: No focal deficit present.     Mental Status: She is alert and oriented to person, place, and time. Mental status is at baseline.     Cranial Nerves: No dysarthria or facial asymmetry.  Psychiatric:        Mood and Affect: Mood normal.        Speech: Speech normal.        Behavior: Behavior normal.        Thought Content: Thought content normal.        Judgment: Judgment normal.      UC Treatments / Results  Labs (all labs ordered are listed, but only abnormal results are displayed) Labs Reviewed  POC COVID19/FLU A&B COMBO - Normal    EKG   Radiology No results found.  Procedures Procedures (including critical care time)  Medications Ordered in UC Medications - No data to display  Initial Impression / Assessment and Plan / UC Course  I have reviewed the triage vital signs and the nursing notes.  Pertinent labs & imaging results that were available during my care of the patient were reviewed by me and considered in my medical decision making (see chart for details).   1. Viral URI with cough Suspect viral URI, viral syndrome.  Strep/viral testing: POC COVID and flu negative.   Physical exam findings reassuring, vital signs hemodynamically stable, and lungs clear, therefore deferred imaging of the chest.  Advised supportive care/prescriptions for symptomatic relief as outlined in AVS.    Counseled patient on potential for adverse effects with medications prescribed/recommended today, strict ER and return-to-clinic precautions discussed, patient verbalized understanding.    Final Clinical Impressions(s) / UC Diagnoses   Final diagnoses:  Viral URI with cough      Discharge Instructions      You have a viral illness which will improve on its own with rest, fluids, and medications to help with your symptoms.  Tylenol, guaifenesin (plain mucinex), and saline nasal sprays may help relieve symptoms.   Two teaspoons of honey in 1 cup of warm water every 4-6 hours may help with throat pains.  Humidifier in room at nighttime may help soothe cough (clean well daily).   For chest pain, shortness of breath, inability to keep food or fluids down without vomiting, fever that does not respond to tylenol or motrin, or any other severe symptoms, please go to the ER for further evaluation. Return to urgent care as needed, otherwise follow-up with PCP.     ED Prescriptions     Medication Sig Dispense Auth. Provider   Guaifenesin 1200 MG TB12 Take 1 tablet (1,200 mg total) by mouth in the morning and at bedtime. 14 tablet Carlisle Beers, FNP   promethazine-dextromethorphan (PROMETHAZINE-DM) 6.25-15 MG/5ML syrup Take 5 mLs by mouth at bedtime as needed for cough. 118 mL Carlisle Beers, FNP      PDMP not reviewed this encounter.   Carlisle Beers, [place] 09/01/23 2025

## 2024-03-09 ENCOUNTER — Emergency Department (HOSPITAL_BASED_OUTPATIENT_CLINIC_OR_DEPARTMENT_OTHER)
Admission: EM | Admit: 2024-03-09 | Discharge: 2024-03-10 | Disposition: A | Discharge status: Home / Self Care | Attending: Emergency Medicine | Admitting: Emergency Medicine

## 2024-03-09 ENCOUNTER — Other Ambulatory Visit: Payer: Self-pay

## 2024-03-09 ENCOUNTER — Emergency Department (HOSPITAL_BASED_OUTPATIENT_CLINIC_OR_DEPARTMENT_OTHER)

## 2024-03-09 ENCOUNTER — Emergency Department (HOSPITAL_BASED_OUTPATIENT_CLINIC_OR_DEPARTMENT_OTHER): Admitting: Radiology

## 2024-03-09 DIAGNOSIS — M542 Cervicalgia: Secondary | ICD-10-CM | POA: Diagnosis not present

## 2024-03-09 DIAGNOSIS — Z720 Tobacco use: Secondary | ICD-10-CM | POA: Diagnosis not present

## 2024-03-09 DIAGNOSIS — R519 Headache, unspecified: Secondary | ICD-10-CM | POA: Insufficient documentation

## 2024-03-09 DIAGNOSIS — M25512 Pain in left shoulder: Secondary | ICD-10-CM | POA: Insufficient documentation

## 2024-03-09 DIAGNOSIS — Y92410 Unspecified street and highway as the place of occurrence of the external cause: Secondary | ICD-10-CM | POA: Insufficient documentation

## 2024-03-09 DIAGNOSIS — R202 Paresthesia of skin: Secondary | ICD-10-CM | POA: Diagnosis not present
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
## 2024-03-10 LAB — HCG, SERUM, QUALITATIVE: Preg, Serum: NEGATIVE

## 2024-03-10 MED ORDER — KETOROLAC TROMETHAMINE 30 MG/ML IJ SOLN
30.0000 mg | Freq: Once | INTRAMUSCULAR | Status: AC
  Administered 2024-03-10: 30 mg via INTRAMUSCULAR
  Filled 2024-03-10: qty 1

## 2024-03-10 MED ORDER — LIDOCAINE 5 % EX PTCH: 1.0000 | MEDICATED_PATCH | CUTANEOUS | 0 refills | Status: AC

## 2024-03-10 MED ORDER — CYCLOBENZAPRINE HCL 10 MG PO TABS: 10.0000 mg | ORAL_TABLET | Freq: Two times a day (BID) | ORAL | 0 refills | Status: AC | PRN

## 2024-03-10 NOTE — Discharge Instructions (Addendum)
 Continue ibuprofen /Tylenol  for management of your pain, do not take any additional ibuprofen  this evening as you received a dose of Toradol in the emergency department, which is also an NSAID.  Continue Flexeril, use up to twice daily as needed for muscle spasms, please be aware that this medication may cause fatigue/drowsiness and do not take this before operating heavy machinery.  You may also continue lidocaine  patches as needed for pain.  Follow-up with your PCP.  Return to the emergency department if your symptoms worsen.
